# Patient Record
Sex: Male | Born: 1955 | Race: Black or African American | Hispanic: No | Smoking: Former smoker
Health system: Southern US, Community
[De-identification: ages and names within clinical notes are randomized; demographics above are authoritative.]

## PROBLEM LIST (undated history)

## (undated) DIAGNOSIS — I639 Cerebral infarction, unspecified: Secondary | ICD-10-CM

## (undated) DIAGNOSIS — Z21 Asymptomatic human immunodeficiency virus [HIV] infection status: Secondary | ICD-10-CM

## (undated) DIAGNOSIS — B20 Human immunodeficiency virus [HIV] disease: Secondary | ICD-10-CM

## (undated) HISTORY — PX: KNEE SURGERY: SHX244

## (undated) HISTORY — DX: Asymptomatic human immunodeficiency virus (hiv) infection status: Z21

## (undated) HISTORY — DX: Human immunodeficiency virus (HIV) disease: B20

## (undated) HISTORY — PX: OTHER SURGICAL HISTORY: SHX169

## (undated) HISTORY — PX: JOINT REPLACEMENT: SHX530

---

## 1998-07-28 ENCOUNTER — Encounter: Payer: Self-pay | Admitting: *Deleted

## 1998-07-28 ENCOUNTER — Emergency Department (HOSPITAL_COMMUNITY): Admission: EM | Admit: 1998-07-28 | Discharge: 1998-07-28 | Payer: Self-pay | Admitting: *Deleted

## 1999-03-30 ENCOUNTER — Emergency Department (HOSPITAL_COMMUNITY): Admission: EM | Admit: 1999-03-30 | Discharge: 1999-03-30 | Payer: Self-pay | Admitting: Emergency Medicine

## 2003-11-29 ENCOUNTER — Inpatient Hospital Stay (HOSPITAL_COMMUNITY): Admission: EM | Admit: 2003-11-29 | Discharge: 2003-12-07 | Payer: Self-pay | Admitting: Emergency Medicine

## 2003-12-22 ENCOUNTER — Encounter: Admission: RE | Admit: 2003-12-22 | Discharge: 2003-12-22 | Payer: Self-pay | Admitting: Internal Medicine

## 2004-05-10 ENCOUNTER — Encounter: Admission: RE | Admit: 2004-05-10 | Discharge: 2004-05-10 | Payer: Self-pay | Admitting: Internal Medicine

## 2004-05-10 ENCOUNTER — Ambulatory Visit (HOSPITAL_COMMUNITY): Admission: RE | Admit: 2004-05-10 | Discharge: 2004-05-10 | Payer: Self-pay | Admitting: Internal Medicine

## 2007-07-03 ENCOUNTER — Emergency Department (HOSPITAL_COMMUNITY): Admission: EM | Admit: 2007-07-03 | Discharge: 2007-07-03 | Payer: Self-pay | Admitting: Emergency Medicine

## 2008-04-23 ENCOUNTER — Emergency Department (HOSPITAL_COMMUNITY): Admission: EM | Admit: 2008-04-23 | Discharge: 2008-04-23 | Payer: Self-pay | Admitting: Emergency Medicine

## 2011-03-03 NOTE — Discharge Summary (Signed)
NAME:  Adam Richardson, Adam Richardson                         ACCOUNT NO.:  [id number]   MEDICAL RECORD NO.:  [id number]                   PATIENT TYPE:  INP   LOCATION:  3010                                 FACILITY:  [facility]   PHYSICIAN:  Corinna L. Lendell Caprice, MD             DATE OF BIRTH:  09/12/1956   DATE OF ADMISSION:  11/29/2003  DATE OF DISCHARGE:  12/07/2003                                 DISCHARGE SUMMARY   DIAGNOSES:  1. Bacteremic pneumococcal pneumonia.  2. Newly diagnosed human immunodeficiency virus infection with CD4 count of     100.  3. Tobacco abuse, counseled against.  4. Hepatitis C antibody positive, hepatitis B surface antibody positive.  5. Pleurisy.   DISCHARGE MEDICATIONS:  1. Amoxicillin 500 mg p.o. t.i.d. for 5 more days.  2. Bactrim DS one p.o. q.d.  3. Tylenol or Motrin as needed.   FOLLOW UP:  With Dr. Orvan Falconer on March 8 at 10:00 for evaluation of highly  active antiretroviral therapy in the ID clinic at which time HIV viral load  can be followed up.   CONDITION ON DISCHARGE:  Stable.   ACTIVITY:  No heavy exertion until followup visit. 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   DIET:  Regular diet.   CONSULTATIONS:  1. Dr. Jayme Cloud.  2. Dr. Jeanie Sewer.  3. Dr. Cliffton Asters.   PERTINENT LABORATORY DATA:  CBC on admission was unremarkable. ABG on 3  liters of oxygen revealed a pH of 7.435, pCO2 of 31, pO2 90. Erythrocyte  sedimentation rate was 83. PT, INR, and PTT was normal. Complete metabolic  panel was significant for a potassium of 3.4, albumin of 2.3, creatinine of  1.7, total bilirubin 1.6. His creatinine decreased to 0.9, and his potassium  normalized with repletion. Cortisol 16. HIV-1 antibody confirmation  positive. Viral load is pending. Hepatitis C antibody positive. Hepatitis B  surface antigen negative. Hepatitis B core antibody negative. Hepatitis B  surface antibody positive. RPR nonreactive. PPD negative. Blood  cultures  revealed streptococcal which was sensitive to penicillin, Levaquin, and  ceftriaxone. Urine for legionella antigen was negative. AFB of the sputum  negative. Clostridium difficile negative. On chest x-ray on admission showed  left greater than right basilar air space opacities with a small left  pleural effusion. CT of the chest showed bilateral air space opacities with  left lower lobe consolidation concerning for pneumonia. No evidence of  pulmonary embolus. Prominent mediastinal and hilar lymph nodes likely  reactive but nonspecific. CT of the lower extremities showed no DVT. Serial  chest x-rays were done.   HISTORY:  Adam Richardson is a 55 year old black male who presented to the  emergency room with hemoptysis. He had a temperature of 99.7 and respiratory  rate of 30, blood pressure 103/64, heart rate 105, oxygen saturation 91% on  room air. He admitted to smoking and reported that he  was at risk for HIV,  but he would not elaborate on the reason why. He had bronchial breath sounds  at the left base with rhonchi. He had no murmurs. He was very thin  appearing. He was admitted to step down, started on IV antibiotics pending  cultures, with a diagnosis of pneumonia and hemoptysis. He was given  handheld nebulizers, and pulmonary was consulted. Blood cultures began  positive for strep pneumonia, and the patient initially refused HIV test but  subsequently agreed. This was positive. When patient was told of the  results, he became quite hopeless, and there was concern over suicidal  ideation. A psych consult was obtained felt like patient was not suicidal  but that his depression was reaction to his new diagnosis. Infectious  disease was consulted. The patient developed some left sided chest pain  which was pleuritic which improved with ibuprofen. At the time of discharge,  he was feeling much better. His lung sounds were clear. He was febrile. He  had normal oxygen saturations  and was ambulating without oxygen. His outlook  was much better, and he was encouraged to follow up at the ID clinic for  further workup and management. He is being set up with indigent medications  at this time.                                                Corinna L. Lendell Caprice, MD    CLS/MEDQ  D:  12/07/2003  T:  12/08/2003  Job:  [id number]   cc:   Cliffton Asters, M.D.  [address] [place]  [place] [address]  Fax: [phone]

## 2012-03-05 ENCOUNTER — Encounter (HOSPITAL_COMMUNITY): Payer: Self-pay | Admitting: Emergency Medicine

## 2012-03-05 ENCOUNTER — Emergency Department (HOSPITAL_COMMUNITY)
Admission: EM | Admit: 2012-03-05 | Discharge: 2012-03-05 | Disposition: A | Discharge status: Home / Self Care | Payer: BC Managed Care – PPO | Attending: Emergency Medicine | Admitting: Emergency Medicine

## 2012-03-05 ENCOUNTER — Emergency Department (HOSPITAL_COMMUNITY): Payer: BC Managed Care – PPO

## 2012-03-05 DIAGNOSIS — W3189XA Contact with other specified machinery, initial encounter: Secondary | ICD-10-CM | POA: Insufficient documentation

## 2012-03-05 DIAGNOSIS — S62609A Fracture of unspecified phalanx of unspecified finger, initial encounter for closed fracture: Secondary | ICD-10-CM | POA: Insufficient documentation

## 2012-03-05 DIAGNOSIS — T148XXA Other injury of unspecified body region, initial encounter: Secondary | ICD-10-CM | POA: Insufficient documentation

## 2012-03-05 DIAGNOSIS — I10 Essential (primary) hypertension: Secondary | ICD-10-CM | POA: Insufficient documentation

## 2012-03-05 MED ORDER — CEPHALEXIN 250 MG PO CAPS: 250.0000 mg | ORAL_CAPSULE | Freq: Four times a day (QID) | ORAL | Status: AC

## 2012-03-05 MED ORDER — OXYCODONE-ACETAMINOPHEN 5-325 MG PO TABS: 1.0000 | ORAL_TABLET | Freq: Four times a day (QID) | ORAL | Status: AC | PRN

## 2012-03-05 MED ORDER — HYDROMORPHONE HCL PF 1 MG/ML IJ SOLN
1.0000 mg | Freq: Once | INTRAMUSCULAR | Status: AC
  Administered 2012-03-05: 1 mg via INTRAMUSCULAR
  Filled 2012-03-05: qty 1

## 2012-03-05 NOTE — ED Notes (Signed)
Basin filled with n/s solution for pt to soak finger

## 2012-03-05 NOTE — ED Provider Notes (Signed)
History     CSN: [id number]  Arrival date & time 03/05/12  2040   First MD Initiated Contact with Patient 03/05/12 2118      Chief Complaint  Patient presents with  . Finger Injury    (Consider location/radiation/quality/duration/timing/severity/associated sxs/prior treatment) HPI Comments: Patient reports he reached under a running lawn mower with his right hand to clear the grass and his right index finger was hit by the blade.  Reports extreme pain in his finger, worse with flexing finger and with palpation.  Denies other injury.  The history is provided by the patient.    History reviewed. No pertinent past medical history.  Past Surgical History  Procedure Date  . Knee surgery   . Shouldr surgery     No family history on file.  History  Substance Use Topics  . Smoking status: Current Everyday Smoker  . Smokeless tobacco: Not on file  . Alcohol Use: Yes      Review of Systems  Skin: Positive for wound.  Neurological: Negative for weakness and numbness.    Allergies  Review of patient's allergies indicates no known allergies.  Home Medications   Current Outpatient Rx  Name Route Sig Dispense Refill  . IMODIUM A-D PO Oral Take 1 tablet by mouth daily as needed. For diarrhea      BP 161/105  Pulse 98  Temp(Src) 98.1 F (36.7 C) (Oral)  Resp 24  SpO2 98%  Physical Exam  Nursing note and vitals reviewed. Constitutional: He is oriented to person, place, and time. He appears well-developed and well-nourished.  HENT:  Head: Normocephalic and atraumatic.  Neck: Neck supple.  Pulmonary/Chest: Effort normal.  Musculoskeletal:       Hands: Neurological: He is alert and oriented to person, place, and time.    ED Course  Procedures (including critical care time)  Labs Reviewed - No data to display Dg Finger Index Right  03/05/2012  *RADIOLOGY REPORT*  Clinical Data: Injured right index finger while removing grass from a lawn mower.  RIGHT INDEX  FINGER 2+V  Comparison: None.  Findings: Comminuted fracture involving the distal tuft of the distal phalanx.  No other fractures.  Well-preserved joint spaces. Well-preserved bone mineral density.  Associated soft tissue injury.  IMPRESSION: Comminuted fracture involving the distal tuft of the distal phalanx.  Original Report Authenticated By: Arnell Sieving, M.D.     No diagnosis found.    MDM  Patient with comminuted fracture of right distal phalanx index finger.  Pt with dried blood and decreased movement secondary to pain.  Pt signed out to [facility], [place], who will reassess patient after pain medication and cleansing of wound.  Possible open fracture.         Dillard Cannon [place], [place] 03/05/12 2154

## 2012-03-05 NOTE — ED Provider Notes (Signed)
Medical screening examination/treatment/procedure(s) were performed by non-physician practitioner and as supervising physician I was immediately available for consultation/collaboration.  Kennethia Lynes K Linker, MD 03/05/12 2220 

## 2012-03-05 NOTE — Progress Notes (Signed)
Orthopedic Tech Progress Note Patient Details:  Adam Richardson May 17, 1956 [id number]  Type of Splint: Finger Splint Location: right hand Splint Interventions: Application    Nikki Dom 03/05/2012, 11:05 PM

## 2012-03-05 NOTE — ED Provider Notes (Signed)
Medical screening examination/treatment/procedure(s) were performed by non-physician practitioner and as supervising physician I was immediately available for consultation/collaboration.  Anesha Hackert K Linker, MD 03/05/12 2337 

## 2012-03-05 NOTE — ED Provider Notes (Signed)
Patient care resumed from [facility], [place] and has been discussed with Dr. Karma Ganja.  Patient has been diagnosed with a comminuted fracture and will be placed in a finger splint and discharged with antibiotics.  He is been advised to followup with orthopedics and verbalizes understanding of the importance of this followup appointment.  Patient has been reexamined with intact sensation and range of motion of the DIP joint PIP.  There is no evidence of open fracture or repairable laceration.  Digital block performed with 2% lidocaine no epinephrine, 2 mL total.  Patient tolerated well.  Tetanus is up to date with last 2-3 years ago.  Discussed high blood pressure with patient and recommended followup with primary care physician. Explained signs of infection & return precautions  #1 distal comminuted fracture #2 Hypertension    Jaci Carrel, PA-C 03/05/12 2254

## 2012-03-05 NOTE — ED Notes (Signed)
PT.PRESENTS WITH LACERATION AT RIGHT DISTAL INDEX FINGER ACCIDENTALLY HIT BY LAWN MOWER BLADE THIS EVENING WITH MODERATE BLEEDING .

## 2012-03-05 NOTE — Discharge Instructions (Signed)
Finger Fracture Fractures of fingers are breaks in the bones of the fingers. There are many types of fractures. There are different ways of treating these fractures, all of which can be correct. Your caregiver will discuss the best way to treat your fracture. TREATMENT  Finger fractures can be treated with:   Non-reduction - this means the bones are in place. The finger is splinted without changing the positions of the bone pieces. The splint is usually left on for about a week to ten days. This will depend on your fracture and what your caregiver thinks.   Closed reduction - the bones are put back into position without using surgery. The finger is then splinted.   ORIF (open reduction and internal fixation) - the fracture site is opened. Then the bone pieces are fixed into place with pins or some type of hardware. This is seldom required. It depends on the severity of the fracture.  Your caregiver will discuss the type of fracture you have and the treatment that will be best for that problem. If surgery is the treatment of choice, the following is information for you to know and also let your caregiver know about prior to surgery. LET YOUR CAREGIVER KNOW ABOUT:  Allergies   Medications taken including herbs, eye drops, over the counter medications, and creams   Use of steroids (by mouth or creams)   Previous problems with anesthetics or Novocaine   Possibility of pregnancy, if this applies   History of blood clots (thrombophlebitis)   History of bleeding or blood problems   Previous surgery   Other health problems  AFTER THE PROCEDURE After surgery, you will be taken to the recovery area where a nurse will check your progress. Once you're awake, stable, and taking fluids well, barring other problems you will be allowed to go home. Once home an ice pack applied to your operative site may help with discomfort and keep the swelling down. HOME CARE INSTRUCTIONS   Follow your  caregiver's instructions as to activities, exercises, physical therapy, and driving a car.   Use your finger and exercise as directed.   Only take over-the-counter or prescription medicines for pain, discomfort, or fever as directed by your caregiver. Do not take aspirin until your caregiver OK's it, as this can increase bleeding immediately following surgery.   Stop using ibuprofen if it upsets your stomach. Let your caregiver know about it.  SEEK MEDICAL CARE IF:  You have increased bleeding (more than a small spot) from the wound or from beneath your splint.   You develop redness, swelling, or increasing pain in the wound or from beneath your splint.   There is pus coming from the wound or from beneath your splint.   An unexplained oral temperature above 102 F (38.9 C) develops, or as your caregiver suggests.   There is a foul smell coming from the wound or dressing or from beneath your splint.  SEEK IMMEDIATE MEDICAL CARE IF:   You develop a rash.   You have difficulty breathing.   You have any allergic problems.  MAKE SURE YOU:   Understand these instructions.   Will watch your condition.   Will get help right away if you are not doing well or get worse.  Document Released: 01/14/2001 Document Revised: 09/21/2011 Document Reviewed: 05/21/2008 Exit[employer] Patient Information 2012 [employer]. 

## 2014-08-12 ENCOUNTER — Encounter (HOSPITAL_COMMUNITY): Payer: Self-pay | Admitting: Emergency Medicine

## 2014-08-12 ENCOUNTER — Emergency Department (HOSPITAL_COMMUNITY)
Admission: EM | Admit: 2014-08-12 | Discharge: 2014-08-12 | Disposition: A | Discharge status: Home / Self Care | Payer: BC Managed Care – PPO | Attending: Emergency Medicine | Admitting: Emergency Medicine

## 2014-08-12 DIAGNOSIS — H5713 Ocular pain, bilateral: Secondary | ICD-10-CM | POA: Diagnosis present

## 2014-08-12 DIAGNOSIS — Z77098 Contact with and (suspected) exposure to other hazardous, chiefly nonmedicinal, chemicals: Secondary | ICD-10-CM

## 2014-08-12 DIAGNOSIS — Z720 Tobacco use: Secondary | ICD-10-CM | POA: Diagnosis not present

## 2014-08-12 MED ORDER — ERYTHROMYCIN 5 MG/GM OP OINT: TOPICAL_OINTMENT | OPHTHALMIC | Status: AC

## 2014-08-12 MED ORDER — PREDNISOLONE ACETATE 1 % OP SUSP: 2.0000 [drp] | Freq: Two times a day (BID) | OPHTHALMIC | Status: DC

## 2014-08-12 MED ORDER — TETRACAINE HCL 0.5 % OP SOLN
2.0000 [drp] | Freq: Once | OPHTHALMIC | Status: AC
  Administered 2014-08-12: 2 [drp] via OPHTHALMIC
  Filled 2014-08-12: qty 2

## 2014-08-12 MED ORDER — FLUORESCEIN SODIUM 1 MG OP STRP
1.0000 | ORAL_STRIP | Freq: Once | OPHTHALMIC | Status: AC
  Administered 2014-08-12: 1 via OPHTHALMIC
  Filled 2014-08-12: qty 1

## 2014-08-12 NOTE — ED Notes (Signed)
Both eyes being irrigated at this time using LR and morgan lens

## 2014-08-12 NOTE — ED Provider Notes (Signed)
CSN: [id number]     Arrival date & time 08/12/14  1847 History  This chart was scribed for Santiago GladHeather Rupal Childress, PA with Gwyneth SproutWhitney Plunkett, MD by Tonye RoyaltyJoshua Chen, ED Scribe. This patient was seen in room TR04C/TR04C and the patient's care was started at 7:22 PM.    No chief complaint on file.  HPI  HPI Comments: Adam Richardson is a 58 y.o. male who presents to the Emergency Department complaining of burning to his eyes, skin surrounding his eyes, and lips status post getting chemical in them at work just PTA. He states he was using Insta-Flo, a drain cleaner product with sodium hydroxide in it; he states he poured 2 bottles into a drain and that chemical came up when he poured in water and it got into his eyes. He states he began irrigating them with water immediately. He states he can see and denies blurry vision. Denies drainage from the eyes.    No past medical history on file. Past Surgical History  Procedure Laterality Date  . Knee surgery    . Shouldr surgery     No family history on file. History  Substance Use Topics  . Smoking status: Current Every Day Smoker  . Smokeless tobacco: Not on file  . Alcohol Use: Yes    Review of Systems  HENT:       Burning feeling to lips and tissue surrounding his eyes  Eyes: Positive for pain and redness. Negative for visual disturbance.    Allergies  Review of patient's allergies indicates no known allergies.  Home Medications   Prior to Admission medications   Medication Sig Start Date End Date Taking? Authorizing Provider  Loperamide HCl (IMODIUM A-D PO) Take 1 tablet by mouth daily as needed. For diarrhea    Historical Provider, MD   There were no vitals taken for this visit. Physical Exam  Nursing note and vitals reviewed. Constitutional: He is oriented to person, place, and time. He appears well-developed and well-nourished.  HENT:  Head: Normocephalic and atraumatic.  Mouth/Throat: Oropharynx is clear and moist.  Eyes:  Conjunctivae are normal. Pupils are equal, round, and reactive to light. Right eye exhibits no discharge. Left eye exhibits no discharge.  Diffuse injection of both eyes Fluorescein uptake of the left eye at the 4 o'clock position PH=7 of both eyes OD 20/50 OS 20/50 OU 20/40     Neck: Normal range of motion. Neck supple.  Cardiovascular: Normal rate, regular rhythm and normal heart sounds.   Pulmonary/Chest: Effort normal and breath sounds normal.  Musculoskeletal: Normal range of motion.  Neurological: He is alert and oriented to person, place, and time.  Skin: Skin is warm and dry.  No erythema, blisters, or evidence of burns to the skin visualized.  Psychiatric: He has a normal mood and affect.    ED Course  Procedures (including critical care time)  DIAGNOSTIC STUDIES: Oxygen Saturation is 100% on room air, normal by my interpretation.    COORDINATION OF CARE: 7:30 PM Discussed treatment plan with patient at beside, the patient agrees with the plan and has no further questions at this time.   Labs Review Labs Reviewed - No data to display  Imaging Review No results found.   EKG Interpretation None     9:15 PM Discussed with Dr. Karleen HampshireSpencer with Ophthalmology.  He recommends Pred Forte and Erythromycin ointment.  He recommends follow up in the office tomorrow.  Patient reports that pain of the eyes has improved. MDM  Final diagnoses:  None   Patient presenting with a chemical exposure to both eyes.  Visual acuity 20/50.  Both eyes flushed with Nona DellMorgan Lens.  PH of both eyes is 7.  Patient with Fluorescein uptake of the left eye.  Patient discussed with Dr. Karleen HampshireSpencer who is on call for Ophthalmology.  He recommends giving the patient Pred Forte and Erythromycin ointment.  He states that the patient can follow up in the office tomorrow.  Patient stable for discharge.  Return precautions given.   Santiago GladHeather Keidrick Murty, PA-C 08/12/14 2259

## 2014-08-12 NOTE — ED Notes (Signed)
Pt to ED with co worker, st's he was trying to unclog a drain with product called Insta-Flo (acid) pt st's he put one bottle in drain and it didn't work so he put the second bottle in and when he put the water in the product boiled up in his face.  Pt c/o burns to bil eyes, eye lids and lips.  Pt st's he instantly flushed eyes with water.

## 2014-08-12 NOTE — ED Notes (Signed)
OD 20/50  OS 20/50  OU 20/40

## 2014-08-13 NOTE — ED Provider Notes (Signed)
Medical screening examination/treatment/procedure(s) were performed by non-physician practitioner and as supervising physician I was immediately available for consultation/collaboration.   EKG Interpretation None        Gwyneth SproutWhitney Carmilla Granville, MD 08/13/14 0020

## 2016-02-23 ENCOUNTER — Ambulatory Visit (INDEPENDENT_AMBULATORY_CARE_PROVIDER_SITE_OTHER): Payer: BC Managed Care – PPO | Admitting: Family Medicine

## 2016-02-23 VITALS — BP 120/64 | HR 64 | Temp 97.7°F | Resp 18 | Ht 67.0 in | Wt 154.0 lb

## 2016-02-23 DIAGNOSIS — B20 Human immunodeficiency virus [HIV] disease: Secondary | ICD-10-CM | POA: Diagnosis not present

## 2016-02-23 DIAGNOSIS — R404 Transient alteration of awareness: Secondary | ICD-10-CM

## 2016-02-23 LAB — POCT URINALYSIS DIP (MANUAL ENTRY)
Bilirubin, UA: NEGATIVE
Blood, UA: NEGATIVE
GLUCOSE UA: NEGATIVE
Ketones, POC UA: NEGATIVE
Leukocytes, UA: NEGATIVE
NITRITE UA: NEGATIVE
Protein Ur, POC: NEGATIVE
Spec Grav, UA: <=1.005
UROBILINOGEN UA: 0.2
pH, UA: 5

## 2016-02-23 LAB — POCT CBC
Granulocyte percent: 45.4 %G (ref 37–80)
HCT, POC: 44.5 % (ref 43.5–53.7)
Hemoglobin: 15.9 g/dL (ref 14.1–18.1)
LYMPH, POC: 1.9 (ref 0.6–3.4)
MCH: 34.5 pg — AB (ref 27–31.2)
MCHC: 35.8 g/dL — AB (ref 31.8–35.4)
MCV: 96.5 fL (ref 80–97)
MID (CBC): 0.4 (ref 0–0.9)
MPV: 8.1 fL (ref 0–99.8)
POC Granulocyte: 2 (ref 2–6.9)
POC LYMPH PERCENT: 10.3 %L (ref 10–50)
POC MID %: 44.3 % — AB (ref 0–12)
Platelet Count, POC: 207 10*3/uL (ref 142–424)
RBC: 4.61 M/uL — AB (ref 4.69–6.13)
RDW, POC: 12.5 %
WBC: 4.3 10*3/uL — AB (ref 4.6–10.2)

## 2016-02-23 LAB — COMPREHENSIVE METABOLIC PANEL
ALT: 35 U/L (ref 9–46)
AST: 33 U/L (ref 10–35)
Albumin: 4.6 g/dL (ref 3.6–5.1)
Alkaline Phosphatase: 82 U/L (ref 40–115)
BUN: 13 mg/dL (ref 7–25)
CHLORIDE: 105 mmol/L (ref 98–110)
CO2: 24 mmol/L (ref 20–31)
Calcium: 9.7 mg/dL (ref 8.6–10.3)
Creat: 1.04 mg/dL (ref 0.70–1.25)
GLUCOSE: 83 mg/dL (ref 65–99)
POTASSIUM: 4.3 mmol/L (ref 3.5–5.3)
Sodium: 140 mmol/L (ref 135–146)
Total Bilirubin: 0.7 mg/dL (ref 0.2–1.2)
Total Protein: 7.9 g/dL (ref 6.1–8.1)

## 2016-02-23 LAB — TSH: TSH: 1.03 m[IU]/L (ref 0.40–4.50)

## 2016-02-23 LAB — POC MICROSCOPIC URINALYSIS (UMFC): MUCUS RE: ABSENT

## 2016-02-23 LAB — GLUCOSE, POCT (MANUAL RESULT ENTRY): POC Glucose: 83 mg/dl (ref 70–99)

## 2016-02-23 NOTE — Patient Instructions (Addendum)
  1. P[facility] PRES[facility] TO [facility] NOW FOR FURTHER EVALUATION OF ALTERED MENTAL STATUS.     IF you received an x-ray today, you will receive an invoice from [facility]. Please contact [facility] at [phone] with questions or concerns regarding your invoice.   IF you received labwork today, you will receive an invoice from [facility]. Please contact [facility] at [phone] with questions or concerns regarding your invoice.   Our billing staff will not be able to assist you with questions regarding bills from these companies.  You will be contacted with the lab results as soon as they are available. The fastest way to get your results is to activate your My Chart account. Instructions are located on the last page of this paperwork. If you have not heard from us regarding the results in 2 weeks, please contact this office.

## 2016-02-23 NOTE — Progress Notes (Signed)
Subjective:    Patient ID: POPE BRUNTY, male    DOB: 1956/08/03, 60 y.o.   MRN: [id number]  02/23/2016  Memory Loss   HPI This 60 y.o. male presents for evaluation of memory loss. Yesterday around 1:00pm had an episode where went into a service room.  Had not eaten anything that day. Started getting sleepy.  Closed eyes and fell asleep.  Woke up and felt a really strange feeling. Unable to talk; had the mindset to talk but unable to speak.  Tried to explain to supervisor that something was wrong.  Supervisor realized that something was wrong with patient.  Left office and went back and tried to talk out loud and unable to speak.  Saw some people and unable to speak back; unable to speak back.  Tried to call wife and only could say is something is worng.  Supervisor found patient and advised them to go home; supervisor/boss and advised patient to seek medical advise. Did not seek medical care; went home and got in bed at 3:00pm.  Woke up around at 800pm; still had not eaten anything for the day. Ate upon awakening; went back to sleep at 10:00pm.  Woke up this morning and feel normal.  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  Also office is next to elevator.  Thinks that cleaning chemicals affected.  Went to work this morning and coworkers state that patient still looks "crazy".  Has not eaten this morning.  Wife thinks that patient is pale.  Feels almost fine.  Yesterday out of my mind and could not think.  Could not express any words.  Wife reports slurred speech when patient called her yesterday on cell phone.  Had a bad childhood but for the past ten years had been doing well.  Boss sent patient here today.  No headache.  No blurred vision; mild dizziness.  +tinnitus chronic.  Normal hearing.  L leg weakness yet recent injury.  No numbness or tingling; +tingling and numbness in B hands chronic.   Last night, dreaming during sleep and urinated a small amount and got up immediately.  This has occurred int he past with heavy alcohol intake.  No alcohol intake.  No tongue biting.  No seizure.  No fever +chills but has been cool.  Needs a repair of the thermostat.  No n/v/d.  +loose stools.  Wife picked up patient yesterday from work; wife took patient home; wife prepared meal at 8:00pm; patient able to speak at 8:00pm when awoke.  Pt was speaking unusual but was much improved. Still having to concentrate really hard on what he is stating.  Still feels that something is wrong.  +tobacco but cutting back cigarettes.  No drugs; last drug use 2008.  Last alcohol intake every night 2 shots to help with sleep; had 2 shots last night; more shots on weekends; drinks 1 pint on weekends.  Drives; has license.  No new medications in past year.  No cessation of medications.  [facility]; [facility] three years ago.  Prostate screening UTD.  In office, really strong chemical odor/smell.   Surrounding office, dishwasher with excessive heat.    HIV +; maintained on medication; cannot pronounced; takes one bid.  Takes gray pill every morning; takes yellow pill at night.  Viral load < 20.  Last blood work last week.  Not sure how got HIV.  [place] in the service; had sex with prostitute.    [facility] [place]; followed every 6 months.    Works two jobs; 60 hours per week.    Per wife, last night patient had a difficult time recalling events of the day.  Move of office occurred 3 weeks ago but yesterday was first day in office.   Review of Systems  Constitutional: Negative for fever, chills, diaphoresis, activity change, appetite change and fatigue.  Respiratory: Negative for cough and shortness of breath.   Cardiovascular: Negative for chest pain, palpitations and leg swelling.  Gastrointestinal: Negative for nausea, vomiting, abdominal pain and diarrhea.    Endocrine: Negative for cold intolerance, heat intolerance, polydipsia, polyphagia and polyuria.  Skin: Negative for color change, rash and wound.  Neurological: Negative for dizziness, tremors, seizures, syncope, facial asymmetry, speech difficulty, weakness, light-headedness, numbness and headaches.  Psychiatric/Behavioral: Positive for confusion and decreased concentration. Negative for suicidal ideas, hallucinations, behavioral problems, sleep disturbance, self-injury, dysphoric mood and agitation. The patient is not nervous/anxious.     Past Medical History  Diagnosis Date  . HIV infection [facility](HCC)    Past Surgical History  Procedure Laterality Date  . Knee surgery    . Shouldr surgery    . Knee surgery    . Joint replacement     No Known Allergies Current Outpatient Prescriptions  Medication Sig Dispense Refill  . acetaminophen (TYLENOL) 500 MG tablet Take 500 mg by mouth every 6 (six) hours as needed for mild pain. Reported on 02/23/2016    . cyclobenzaprine (FLEXERIL) 5 MG tablet Take 5 mg by mouth 3 (three) times daily as needed for muscle spasms. Reported on 02/23/2016    . erythromycin ophthalmic ointment Place a 1/2 inch ribbon of ointment into the lower eyelid.  Apply to eyelid at bedtime (Patient not taking: Reported on 02/23/2016) 1 g 0  . gabapentin (NEURONTIN) 600 MG tablet Take 600 mg by mouth daily. Reported on 02/23/2016    . ibuprofen (ADVIL,MOTRIN) 600 MG tablet Take 600 mg by mouth every 6 (six) hours as needed for moderate pain. Reported on 02/23/2016    . prednisoLONE acetate (PRED FORTE) 1 % ophthalmic suspension Place 2 drops into both eyes 2 (two) times daily. (Patient not taking: Reported on 02/23/2016) 5 mL 0  . traMADol (ULTRAM) 50 MG tablet Take 50 mg by mouth every 6 (six) hours as needed for moderate pain. Reported on 02/23/2016     No current facility-administered medications for this visit.   Social History   Social History  . Marital Status: Divorced     Spouse Name: N/A  . Number of Children: N/A  . Years of Education: N/A   Occupational History  . Not on file.   Social History Main Topics  . Smoking status: Current Every Day Smoker  . Smokeless tobacco: Not on file  . Alcohol Use: Yes     Comment: one everyday  . Drug Use: No  . Sexual Activity: Not on file   Other Topics Concern  . Not on file   Social History Narrative   Family History  Problem Relation Age of Onset  . Diabetes Mother   . Heart disease Mother   . Diabetes Father   . Heart disease Father        Objective:    BP 120/64 mmHg  Pulse 64  Temp(Src) 97.7 F (36.5 C)  Resp 18  Ht 5\' 7"  (1.702 m)  Wt 154 lb (69.854 kg)  BMI 24.11 kg/m2  SpO2 99% Physical Exam  Constitutional: He is oriented to person, place, and time. He appears well-developed and well-nourished. No distress.  HENT:  Head: Normocephalic and atraumatic.  Right Ear: External ear normal.  Left Ear: External ear normal.  Nose: Nose normal.  Mouth/Throat: Oropharynx is clear and moist.  Eyes: Conjunctivae and EOM are normal. Pupils are equal, round, and reactive to light.  Neck: Normal range of motion. Neck supple. Carotid bruit is not present. No thyromegaly present.  Cardiovascular: Normal rate, regular rhythm, normal heart sounds and intact distal pulses.  Exam reveals no gallop and no friction rub.   No murmur heard. Pulmonary/Chest: Effort normal and breath sounds normal. He has no wheezes. He has no rales.  Abdominal: Soft. Bowel sounds are normal. He exhibits no distension and no mass. There is no tenderness. There is no rebound and no guarding.  Lymphadenopathy:    He has no cervical adenopathy.  Neurological: He is alert and oriented to person, place, and time. No cranial nerve deficit. He exhibits normal muscle tone. Coordination normal.  Skin: Skin is warm and dry. No rash noted. He is not diaphoretic.  Psychiatric: He has a normal mood and affect. His behavior is normal.  Judgment and thought content normal.  Nursing note and vitals reviewed.  Results for orders placed or performed in visit on 02/23/16  Urine culture  Result Value Ref Range   Colony Count NO GROWTH    Organism ID, Bacteria NO GROWTH   Comprehensive metabolic panel  Result Value Ref Range   Sodium 140 135 - 146 mmol/L   Potassium 4.3 3.5 - 5.3 mmol/L   Chloride 105 98 - 110 mmol/L   CO2 24 20 - 31 mmol/L   Glucose, Bld 83 65 - 99 mg/dL   BUN 13 7 - 25 mg/dL   Creat 1.61 0.96 - 0.45 mg/dL   Total Bilirubin 0.7 0.2 - 1.2 mg/dL   Alkaline Phosphatase 82 40 - 115 U/L   AST 33 10 - 35 U/L   ALT 35 9 - 46 U/L   Total Protein 7.9 6.1 - 8.1 g/dL   Albumin 4.6 3.6 - 5.1 g/dL   Calcium 9.7 8.6 - 40.9 mg/dL  TSH  Result Value Ref Range   TSH 1.03 0.40 - 4.50 mIU/L  POCT CBC  Result Value Ref Range   WBC 4.3 (A) 4.6 - 10.2 K/uL   Lymph, poc 1.9 0.6 - 3.4   POC LYMPH PERCENT 10.3 10 - 50 %L   MID (cbc) 0.4 0 - 0.9   POC MID % 44.3 (A) 0 - 12 %M   POC Granulocyte 2.0 2 - 6.9   Granulocyte percent 45.4 37 - 80 %G   RBC 4.61 (A) 4.69 - 6.13 M/uL   Hemoglobin 15.9 14.1 - 18.1 g/dL   HCT, POC 81.1 91.4 - 53.7 %   MCV 96.5 80 - 97 fL   MCH, POC 34.5 (A) 27 - 31.2 pg   MCHC 35.8 (A) 31.8 - 35.4 g/dL   RDW, POC 78.2 %   Platelet Count, POC 207 142 - 424 K/uL   MPV 8.1 0 - 99.8 fL  POCT glucose (manual entry)  Result Value Ref Range   POC Glucose 83 70 - 99 mg/dl  POCT urinalysis dipstick  Result Value Ref Range   Color, UA yellow yellow   Clarity, UA clear clear   Glucose, UA negative negative  Bilirubin, UA negative negative   Ketones, POC UA negative negative   Spec Grav, UA <=1.005    Blood, UA negative negative   pH, UA 5.0    Protein Ur, POC negative negative   Urobilinogen, UA 0.2    Nitrite, UA Negative Negative   Leukocytes, UA Negative Negative  POCT Microscopic Urinalysis ([facility])  Result Value Ref Range   WBC,UR,HPF,POC None None WBC/hpf   RBC,UR,HPF,POC None None  RBC/hpf   Bacteria None None, Too numerous to count   Mucus Absent Absent   Epithelial Cells, UR Per Microscopy None None, Too numerous to count cells/hpf       Assessment & Plan:   1. Transient alteration of awareness   2. HIV disease (HCC)    -New. -Onset 24 hours ago with mild improvement yet has not returned to baseline. -pt reports compliance HIV medications and reports undetectable viral load; no records for review. -to ED at the [facility] in [place] for CT head, cardiac enzymes, labs, further work up. -potential chemical exposure at work yesterday with onset of symptoms.   -normal neurological exam in office.  Wife desires to transport patient to [facility]; agreeable due to gradual improvement and 24 hour duration of symptoms.   Orders Placed This Encounter  Procedures  . Urine culture  . Comprehensive metabolic panel  . TSH  . POCT CBC  . POCT glucose (manual entry)  . POCT urinalysis dipstick  . POCT Microscopic Urinalysis ([facility])  . EKG 12-Lead   No orders of the defined types were placed in this encounter.    No Follow-up on file.    Christophe Rising Paulita Fujita, M.D. [facility] [address] [place], [place]  [address] [phone] phone [phone] fax

## 2016-02-24 LAB — URINE CULTURE
Colony Count: NO GROWTH
ORGANISM ID, BACTERIA: NO GROWTH

## 2016-03-19 ENCOUNTER — Encounter: Payer: Self-pay | Admitting: Family Medicine

## 2016-07-08 ENCOUNTER — Observation Stay (HOSPITAL_COMMUNITY)
Admission: EM | Admit: 2016-07-08 | Discharge: 2016-07-10 | Disposition: A | Discharge status: Home Health | Payer: BC Managed Care – PPO | Attending: Internal Medicine | Admitting: Internal Medicine

## 2016-07-08 ENCOUNTER — Encounter (HOSPITAL_COMMUNITY): Payer: Self-pay | Admitting: *Deleted

## 2016-07-08 ENCOUNTER — Observation Stay (HOSPITAL_COMMUNITY): Payer: BC Managed Care – PPO

## 2016-07-08 ENCOUNTER — Emergency Department (HOSPITAL_COMMUNITY): Payer: BC Managed Care – PPO

## 2016-07-08 DIAGNOSIS — I82409 Acute embolism and thrombosis of unspecified deep veins of unspecified lower extremity: Secondary | ICD-10-CM | POA: Diagnosis not present

## 2016-07-08 DIAGNOSIS — F1410 Cocaine abuse, uncomplicated: Secondary | ICD-10-CM | POA: Insufficient documentation

## 2016-07-08 DIAGNOSIS — G8191 Hemiplegia, unspecified affecting right dominant side: Secondary | ICD-10-CM | POA: Diagnosis not present

## 2016-07-08 DIAGNOSIS — F17200 Nicotine dependence, unspecified, uncomplicated: Secondary | ICD-10-CM | POA: Insufficient documentation

## 2016-07-08 DIAGNOSIS — B20 Human immunodeficiency virus [HIV] disease: Secondary | ICD-10-CM | POA: Insufficient documentation

## 2016-07-08 DIAGNOSIS — G40909 Epilepsy, unspecified, not intractable, without status epilepticus: Principal | ICD-10-CM | POA: Insufficient documentation

## 2016-07-08 DIAGNOSIS — R569 Unspecified convulsions: Secondary | ICD-10-CM

## 2016-07-08 DIAGNOSIS — Z7982 Long term (current) use of aspirin: Secondary | ICD-10-CM | POA: Insufficient documentation

## 2016-07-08 DIAGNOSIS — R4701 Aphasia: Secondary | ICD-10-CM | POA: Insufficient documentation

## 2016-07-08 DIAGNOSIS — E785 Hyperlipidemia, unspecified: Secondary | ICD-10-CM

## 2016-07-08 DIAGNOSIS — Z8673 Personal history of transient ischemic attack (TIA), and cerebral infarction without residual deficits: Secondary | ICD-10-CM | POA: Diagnosis not present

## 2016-07-08 DIAGNOSIS — B1920 Unspecified viral hepatitis C without hepatic coma: Secondary | ICD-10-CM | POA: Clinically undetermined

## 2016-07-08 DIAGNOSIS — R262 Difficulty in walking, not elsewhere classified: Secondary | ICD-10-CM

## 2016-07-08 DIAGNOSIS — I10 Essential (primary) hypertension: Secondary | ICD-10-CM | POA: Insufficient documentation

## 2016-07-08 DIAGNOSIS — Z79899 Other long term (current) drug therapy: Secondary | ICD-10-CM | POA: Diagnosis not present

## 2016-07-08 DIAGNOSIS — R269 Unspecified abnormalities of gait and mobility: Secondary | ICD-10-CM

## 2016-07-08 DIAGNOSIS — F1910 Other psychoactive substance abuse, uncomplicated: Secondary | ICD-10-CM | POA: Diagnosis not present

## 2016-07-08 DIAGNOSIS — M6281 Muscle weakness (generalized): Secondary | ICD-10-CM

## 2016-07-08 HISTORY — DX: Cerebral infarction, unspecified: I63.9

## 2016-07-08 LAB — URINALYSIS, ROUTINE W REFLEX MICROSCOPIC
BILIRUBIN URINE: NEGATIVE
GLUCOSE, UA: NEGATIVE mg/dL
Ketones, ur: NEGATIVE mg/dL
Leukocytes, UA: NEGATIVE
NITRITE: NEGATIVE
PH: 5.5 (ref 5.0–8.0)
Protein, ur: NEGATIVE mg/dL
SPECIFIC GRAVITY, URINE: 1.011 (ref 1.005–1.030)

## 2016-07-08 LAB — URINE MICROSCOPIC-ADD ON

## 2016-07-08 LAB — BASIC METABOLIC PANEL
ANION GAP: 16 — AB (ref 5–15)
BUN: 11 mg/dL (ref 6–20)
CHLORIDE: 109 mmol/L (ref 101–111)
CO2: 14 mmol/L — AB (ref 22–32)
Calcium: 9.4 mg/dL (ref 8.9–10.3)
Creatinine, Ser: 1.18 mg/dL (ref 0.61–1.24)
GFR calc Af Amer: >60 mL/min (ref >60)
GFR calc non Af Amer: >60 mL/min (ref >60)
GLUCOSE: 129 mg/dL — AB (ref 65–99)
POTASSIUM: 3.8 mmol/L (ref 3.5–5.1)
Sodium: 139 mmol/L (ref 135–145)

## 2016-07-08 LAB — ETHANOL

## 2016-07-08 LAB — RAPID URINE DRUG SCREEN, HOSP PERFORMED
Amphetamines: NOT DETECTED
BARBITURATES: NOT DETECTED
Benzodiazepines: NOT DETECTED
COCAINE: NOT DETECTED
Opiates: NOT DETECTED
TETRAHYDROCANNABINOL: NOT DETECTED

## 2016-07-08 LAB — CBG MONITORING, ED: Glucose-Capillary: 116 mg/dL — ABNORMAL HIGH (ref 65–99)

## 2016-07-08 LAB — CBC
HEMATOCRIT: 42.3 % (ref 39.0–52.0)
HEMOGLOBIN: 14 g/dL (ref 13.0–17.0)
MCH: 31.7 pg (ref 26.0–34.0)
MCHC: 33.1 g/dL (ref 30.0–36.0)
MCV: 95.7 fL (ref 78.0–100.0)
Platelets: 210 10*3/uL (ref 150–400)
RBC: 4.42 MIL/uL (ref 4.22–5.81)
RDW: 13.4 % (ref 11.5–15.5)
WBC: 7.1 10*3/uL (ref 4.0–10.5)

## 2016-07-08 LAB — MRSA PCR SCREENING: MRSA by PCR: NEGATIVE

## 2016-07-08 MED ORDER — SODIUM CHLORIDE 0.9 % IV SOLN
1000.0000 mg | Freq: Once | INTRAVENOUS | Status: AC
  Administered 2016-07-08: 1000 mg via INTRAVENOUS
  Filled 2016-07-08: qty 10

## 2016-07-08 MED ORDER — LORAZEPAM 2 MG/ML IJ SOLN: 1.0000 mg | Freq: Four times a day (QID) | INTRAMUSCULAR | Status: DC | PRN

## 2016-07-08 MED ORDER — GADOBENATE DIMEGLUMINE 529 MG/ML IV SOLN
15.0000 mL | Freq: Once | INTRAVENOUS | Status: AC | PRN
  Administered 2016-07-08: 15 mL via INTRAVENOUS

## 2016-07-08 MED ORDER — SODIUM CHLORIDE 0.9 % IV BOLUS (SEPSIS)
1000.0000 mL | Freq: Once | INTRAVENOUS | Status: AC
  Administered 2016-07-08: 1000 mL via INTRAVENOUS

## 2016-07-08 MED ORDER — VITAMIN B-1 100 MG PO TABS
100.0000 mg | ORAL_TABLET | Freq: Every day | ORAL | Status: DC
  Administered 2016-07-09 – 2016-07-10 (×2): 100 mg via ORAL
  Filled 2016-07-08 (×2): qty 1

## 2016-07-08 MED ORDER — FOLIC ACID 1 MG PO TABS
1.0000 mg | ORAL_TABLET | Freq: Every day | ORAL | Status: DC
Start: 2016-07-08 — End: 2016-07-10
  Administered 2016-07-09 – 2016-07-10 (×2): 1 mg via ORAL
  Filled 2016-07-08 (×2): qty 1

## 2016-07-08 MED ORDER — LORAZEPAM 2 MG/ML IJ SOLN: 2.0000 mg | Freq: Once | INTRAMUSCULAR | Status: DC | PRN

## 2016-07-08 MED ORDER — LORAZEPAM 2 MG/ML IJ SOLN
INTRAMUSCULAR | Status: AC
  Filled 2016-07-08: qty 1

## 2016-07-08 MED ORDER — LORAZEPAM 1 MG PO TABS: 1.0000 mg | ORAL_TABLET | Freq: Four times a day (QID) | ORAL | Status: DC | PRN

## 2016-07-08 MED ORDER — TETANUS-DIPHTH-ACELL PERTUSSIS 5-2.5-18.5 LF-MCG/0.5 IM SUSP
0.5000 mL | Freq: Once | INTRAMUSCULAR | Status: AC
  Administered 2016-07-09: 0.5 mL via INTRAMUSCULAR
  Filled 2016-07-08 (×2): qty 0.5

## 2016-07-08 MED ORDER — LORAZEPAM 2 MG/ML IJ SOLN
2.0000 mg | Freq: Once | INTRAMUSCULAR | Status: AC
  Administered 2016-07-08: 2 mg via INTRAVENOUS

## 2016-07-08 MED ORDER — ADULT MULTIVITAMIN W/MINERALS CH
1.0000 | ORAL_TABLET | Freq: Every day | ORAL | Status: DC
  Administered 2016-07-09 – 2016-07-10 (×2): 1 via ORAL
  Filled 2016-07-08 (×2): qty 1

## 2016-07-08 MED ORDER — ENOXAPARIN SODIUM 40 MG/0.4ML ~~LOC~~ SOLN
40.0000 mg | SUBCUTANEOUS | Status: DC
  Administered 2016-07-08 – 2016-07-10 (×3): 40 mg via SUBCUTANEOUS
  Filled 2016-07-08 (×3): qty 0.4

## 2016-07-08 MED ORDER — SODIUM CHLORIDE 0.9 % IV SOLN
INTRAVENOUS | Status: DC
  Administered 2016-07-08: 18:00:00 via INTRAVENOUS
  Administered 2016-07-10: 125 mL/h via INTRAVENOUS

## 2016-07-08 MED ORDER — THIAMINE HCL 100 MG/ML IJ SOLN
100.0000 mg | Freq: Every day | INTRAMUSCULAR | Status: DC
  Administered 2016-07-08: 100 mg via INTRAVENOUS
  Filled 2016-07-08: qty 2

## 2016-07-08 MED ORDER — KETOROLAC TROMETHAMINE 30 MG/ML IJ SOLN
30.0000 mg | Freq: Four times a day (QID) | INTRAMUSCULAR | Status: DC | PRN
  Administered 2016-07-08 – 2016-07-09 (×2): 30 mg via INTRAVENOUS
  Filled 2016-07-08 (×2): qty 1

## 2016-07-08 NOTE — ED Notes (Signed)
Attempted report 

## 2016-07-08 NOTE — H&P (Signed)
Date: 07/08/2016               Patient Name:  Adam Richardson MRN: [id number]  DOB: [birth year] Age / Sex: 60 y.o., male   PCP: Konrad Felix, NP         Medical Service: Internal Medicine Teaching Service         Attending Physician: Dr. Judyann Munson, MD    First Contact: Dr. Thomasene Lot Pager: [phone]  Second Contact: Dr. Gara Kroner Pager: [phone]       After Hours (After 5p/  First Contact Pager: [phone]  weekends / holidays): Second Contact Pager: [phone]   Chief Complaint: Seizure  History of Present Illness: The patient is a 60 year old male with a past medical history of HIV (CD4 497 12/2015, detectable viral load on 02/16/2016 concerning for partial compliance with ART of abacavir/dolutegravir/lamivudine), hepatitis C, polysubstance abuse (cocaine, tobacco, alcohol) and left MCA stroke who presents to the [facility] emergency department on 07/08/2016 by EMS for seizure-like activity. History of present illness was obtained from other providers and the electronic medical record as the patient was postictal, sedated and unable to participate in the examination. Per the emergency department physician the patient had tonic-clonic movement this morning, his wife was awakened to use the restroom and saw him shaking. This lasted approximately 5 minutes. Following this episode 911 was called and the patient was brought to the emergency department for further evaluation and treatment. Per the wife the patient has been in his usual state of health without fevers, vomiting, cough or other infectious prodrome.  In the emergency department the patient was afebrile. BP 138/74, pulse 92, respiratory rate 15 satting 100% on room air. Basic metabolic panel demonstrated CO2 of 14 and glucose of 129. CBC was entirely within normal limits. Ethanol level was negative. Urinalysis without evidence of infection. Rapid drug screen negative. MRI brain with no acute intracranial abnormality, chronic  left MCA infarct which could potentially be a seizure focus. While in the emergency department, following the patient's MRI, he began to have a bilateral full body seizure lasting approximately 2 minutes. While in the emergency department the patient was given lorazepam and loaded with 1000 mg of Keppra. Neurology has been consulted and an EEG is being obtained.  Meds:  Current Meds  Medication Sig  . abacavir-dolutegravir-lamiVUDine (TRIUMEQ) 600-50-300 MG tablet Take 1 tablet by mouth daily.  Marland Kitchen amLODipine (NORVASC) 10 MG tablet Take 5 mg by mouth daily.  Marland Kitchen aspirin 81 MG chewable tablet Chew 81 mg by mouth daily.  Marland Kitchen atorvastatin (LIPITOR) 40 MG tablet Take 20 mg by mouth at bedtime.  . baclofen (LIORESAL) 10 MG tablet Take 10 mg by mouth 3 (three) times daily as needed for muscle spasms.  Marland Kitchen docusate sodium (COLACE) 100 MG capsule Take 200 mg by mouth daily as needed (to prevent constipation).  Marland Kitchen dolutegravir (TIVICAY) 50 MG tablet Take 50 mg by mouth at bedtime.  . Elbasvir-Grazoprevir (ZEPATIER) 50-100 MG TABS Take 1 tablet by mouth daily. 12 week course for Hep C treatment to end on 07/25/16  . ibuprofen (ADVIL,MOTRIN) 600 MG tablet Take 600 mg by mouth 3 (three) times daily as needed (pain and inflammation). Reported on 02/23/2016  . Multiple Vitamin (MULTIVITAMIN WITH MINERALS) TABS tablet Take 1 tablet by mouth daily.  . traMADol (ULTRAM) 50 MG tablet Take 50 mg by mouth 4 (four) times daily as needed (pain). Reported on 02/23/2016     Allergies: Allergies as  of 07/08/2016  . (No Known Allergies)   Past Medical History:  Diagnosis Date  . HIV infection (HCC)   . Stroke [facility](HCC)     Family History: Unable to be obtained at time of presentation secondary to mental status  Social History: Patient with known polysubstance abuse  Review of Systems: A complete ROS was unable to be obtained secondary to mental status in a postictal patient sedated with lorazepam and loaded with  Keppra  Physical Exam: Blood pressure (!) 168/108, pulse 88, temperature 97 F (36.1 C), temperature source Rectal, resp. rate 21, height 5\' 10"  (1.778 m), weight 160 lb (72.6 kg), SpO2 100 %. Physical Exam  Constitutional: He appears well-developed and well-nourished.  Postictal, drowsy  HENT:  Head: Normocephalic and atraumatic.  Cardiovascular: Normal rate and regular rhythm.  Exam reveals no gallop and no friction rub.   No murmur heard. Respiratory: Effort normal and breath sounds normal.  GI: Soft. Bowel sounds are normal. He exhibits no distension.  Musculoskeletal: He exhibits no edema.  Neurological:  Patient was post ictal. He was not formally oriented 3.     EKG: No evidence of acute ST segment changes  CXR: Not obtained  Assessment & Plan by Problem: [name] is a 60 year old African-American male with a past medical history of HIV, polysubstance abuse, hepatitis C and previous left MCA stroke who presents with a one-day history of seizures.  1. Seizures Patient presented with a seizure at home and a single witnessed seizure in the emergency department. It is unclear if he has had seizures in the past. Currently, he has no evidence of infection. His urine drug screen was negative. MRI of the brain demonstrates chronic left MCA infarct without acute intracranial abnormality. The patient's previous left MCA infarct may be acting as an epileptogenic focus and may be the etiology of the patient's seizures. -- Keppra 1000 mg 1 then 500 mg twice a day -- Neurology consulted -- EEG  2. Polysubstance abuse Patient with known polysubstance abuse including tobacco, cocaine and alcohol. UDS the emergency department was negative. -- CIWA protocol -- Folic acid, thiamine  3. HIV Patient with a history of HIV infection. Most recent CD4 count on 12/2015 of 497. Patient currently nothing by mouth secondary to seizure activity. -- Restart HIV medications when tolerating PO --  Home Regimen (abacavir/dolutegravir/lamivudine)  4. DVT/PE prophylaxis -- Lovenox 40 mg subcutaneous injection once daily  Dispo: Admit patient to Observation with expected length of stay less than 2 midnights.  Signed: Thomasene LotJames Elhadj Girton, MD 07/08/2016, 12:44 PM  Pager: [phone]

## 2016-07-08 NOTE — Consult Note (Signed)
Neurology Consultation Reason for Consult: Seizures Referring Physician: Rancour, S  CC: seizures  History is obtained from:patient  HPI: Adam Richardson is a 60 y.o. male with a history of previous stroke and HIV who presents with new onset seizures. He apparently had a seizure around and was improving, following commands but not speaking. He then had subsequent recurrent seizure which lasted approximately 2 minutes. He was given Ativan, and since that time has been postictal and sedated and neurology has been consulted for further evaluation and recommendations.     ROS:   Unable to obtain due to altered mental status.   Past Medical History:  Diagnosis Date  . HIV infection (HCC)   . Stroke [facility])      Family History  Problem Relation Age of Onset  . Diabetes Mother   . Heart disease Mother   . Diabetes Father   . Heart disease Father      Social History:  reports that he has been smoking.  He does not have any smokeless tobacco history on file. He reports that he drinks alcohol. He reports that he does not use drugs.   Exam: Current vital signs: BP 138/74 (BP Location: Right Arm)   Pulse 92   Temp 97 F (36.1 C) (Rectal)   Resp 15   Ht 5\' 10"  (1.778 m)   Wt 72.6 kg (160 lb)   SpO2 100%   BMI 22.96 kg/m  Vital signs in last 24 hours: Temp:  [97 F (36.1 C)-98.4 F (36.9 C)] 97 F (36.1 C) (09/23 0920) Pulse Rate:  [66-104] 92 (09/23 1001) Resp:  [15-23] 15 (09/23 1001) BP: (78-160)/(48-80) 138/74 (09/23 1001) SpO2:  [93 %-100 %] 100 % (09/23 1001) Weight:  [72.6 kg (160 lb)] 72.6 kg (160 lb) (09/23 0543)   Physical Exam  Constitutional: Appears well-developed and well-nourished.  Psych: Affect appropriate to situation Eyes: No scleral injection HENT: No OP obstrucion Head: Normocephalic.  Cardiovascular: Normal rate and regular rhythm.  Respiratory: Effort normal and breath sounds normal to anterior ascultation GI: Soft.  No distension. There is  no tenderness.  Skin: WDI  Neuro: Mental Status: Patient is Obtunded, but does open eyes to stimulation. He does not follow commands. He does not currently fixate or track. Cranial Nerves: II: He does not blink to threat. Pupils are equal, round, and reactive to light.   III,IV, VI: No clear eye deviation V: VII: Blinks to eyelid stimulation bilaterally VIII, X, XI, XII: Unable to assess secondary to patient's altered mental status.  Motor: He clearly has a fairly dense right hemiparesis, but does withdraw to noxious stimuli in both the arm and leg. Sensory: He responds to noxious stimuli in all 4 extremities, but less so on the right Cerebellar: Does not perform    I have reviewed labs in epic and the results pertinent to this consultation are: Bmp - unremarkable  I have reviewed the images obtained:MRI brain - old L MCA territory infarction. No new findings.   Impression: 60 year old male with new onset seizures likely secondary to previous infarct. A clear evidence of infection (e.g. fevers, white count, etc.) And apparently he has had good compliance with HIV control. In people with structural brain disease, does not uncommon to have prolonged postictal periods, but the fact that he was following commands prior to his second seizure is reassuring. I will get a stat EEG to rule out ongoing seizure just to be safe, however.  Recommendations: 1) EEG 2) Keppra 1  g 1 then 500 mg twice a day 3) neurology will continue to follow.   Ritta SlotMcNeill Bentlee Benningfield, MD [facility] [phone]  If 7pm- 7am, please page neurology on call as listed in AMION.

## 2016-07-08 NOTE — ED Notes (Signed)
Pt returned from MRI and started having a bilateral fully body seizure lasting approx 2 minutes in length before returning to the room.  Sarita BottomNicole Piscotta, PA was at bedside.

## 2016-07-08 NOTE — Progress Notes (Signed)
STAT EEG completed; results pending. Dr Kirkpatrick notified. 

## 2016-07-08 NOTE — ED Triage Notes (Signed)
Pt to ED by EMS c/o possible seizure at home. Wife called EMS after pt "had a seizure and fell out of bed." Pt appears postictal on arrival, will follow commands, nonverbal. Tongue trauma noted with incontinence. C-collar placed by EMS.

## 2016-07-08 NOTE — ED Notes (Signed)
Attempted to obtain urine specimen; Pt unable to provide one at this time 

## 2016-07-08 NOTE — ED Notes (Signed)
Patient transported to MRI 

## 2016-07-08 NOTE — ED Provider Notes (Signed)
MSE was initiated and I personally evaluated the patient and placed orders (if any) at  6:02 AM on July 08, 2016.  The patient appears stable so that the remainder of the MSE may be completed by another provider.  Patient presents by EMS after reported seizure. No collateral information available. EMS reports wife reported seizure activity and patient falling out of bed. No known history of seizures. Chart review reveals history of HIV. Per EMS, he was altered and had notable incontinence and a tongue laceration.  He is noncontributory to history taking. He will respond to his name and follows simple commands but will not speak. Vital signs are reassuring. Lab work and CT head and neck obtained.   Shon Batonourtney F Horton, MD 07/08/16 [phone]

## 2016-07-08 NOTE — ED Notes (Signed)
EEG at bedside.

## 2016-07-08 NOTE — ED Notes (Signed)
RN and PA notified of decreased BP

## 2016-07-08 NOTE — ED Notes (Signed)
Wife at bedside now, woke up this morning to pt having full body convulsions then fell out of bed. Pt does not have hx of seizures. Hx of stroke in May with R hand deficits

## 2016-07-08 NOTE — ED Provider Notes (Signed)
[facility]-EMER[facility]ENCY DEPT Provider Note   CSN: [id number] Arrival date & time: 07/08/16  4098     History   Chief Complaint Chief Complaint  Patient presents with  . Seizures    HPI  Blood pressure 116/77, pulse 87, temperature 98.4 F (36.9 C), temperature source Oral, resp. rate 18, height 5\' 10"  (1.778 m), weight 72.6 kg, SpO2 95 %.  BRICK KETCHER is a 60 y.o. male in by EMS for seizure like activity, level V caveat secondary to altered mental status. History is supplied by wife. Past medical history significant for HIV, follows at the [place], unknown last CD4 count, also hepatitis C which is clearing and has been treated, recent stroke in May, no seizure history. Patient had tonic-clonic movement this morning, his wife was awakened to use the restroom and saw him shaking. Lasted for approximately 5 minutes. He was on the bed, she was concerned she was would fall off the bed and tried to pull him back on, 911 was called. Had been in his recent state of health with no fevers, vomiting, cough or other infectious prodrome. As per wife, he used to be a heavy drinker however he has not drank alcohol since his stroke in May she was out of town Monday through Thursday and [place], states that when she got home she found multiple large beer bottles in the trash. She has not drank in the last 48 hours.    HPI  Past Medical History:  Diagnosis Date  . HIV infection (HCC)   . Stroke [facility])     Patient Active Problem List   Diagnosis Date Noted  . Hepatitis C 07/08/2016  . HLD (hyperlipidemia) 07/08/2016  . Seizure (HCC) 07/08/2016  . HIV disease (HCC) 02/23/2016    Past Surgical History:  Procedure Laterality Date  . JOINT REPLACEMENT    . KNEE SURGERY    . KNEE SURGERY    . SHOULDR SURGERY         Home Medications    Prior to Admission medications   Medication Sig Start Date End Date Taking? Authorizing Provider  abacavir-dolutegravir-lamiVUDine (TRIUMEQ) 600-50-300 MG  tablet Take 1 tablet by mouth daily.   Yes Historical Provider, MD  aspirin 81 MG chewable tablet Chew 81 mg by mouth daily.   Yes Historical Provider, MD  atorvastatin (LIPITOR) 40 MG tablet Take 20 mg by mouth at bedtime.   Yes Historical Provider, MD  baclofen (LIORESAL) 10 MG tablet Take 10 mg by mouth 3 (three) times daily as needed for muscle spasms.   Yes Historical Provider, MD  docusate sodium (COLACE) 100 MG capsule Take 200 mg by mouth daily as needed (to prevent constipation).   Yes Historical Provider, MD  dolutegravir (TIVICAY) 50 MG tablet Take 50 mg by mouth at bedtime.   Yes Historical Provider, MD  ibuprofen (ADVIL,MOTRIN) 600 MG tablet Take 600 mg by mouth 3 (three) times daily as needed (pain and inflammation). Reported on 02/23/2016   Yes Historical Provider, MD  acetaminophen (TYLENOL) 500 MG tablet Take 500 mg by mouth every 6 (six) hours as needed for mild pain. Reported on 02/23/2016    Historical Provider, MD  cyclobenzaprine (FLEXERIL) 5 MG tablet Take 5 mg by mouth 3 (three) times daily as needed for muscle spasms. Reported on 02/23/2016    Historical Provider, MD  erythromycin ophthalmic ointment Place a 1/2 inch ribbon of ointment into the lower eyelid.  Apply to eyelid at bedtime Patient not taking: Reported on 02/23/2016 08/12/14  Heather Laisure, PA-C  gabapentin (NEURONTIN) 600 MG tablet Take 600 mg by mouth daily. Reported on 02/23/2016    Historical Provider, MD  prednisoLONE acetate (PRED FORTE) 1 % ophthalmic suspension Place 2 drops into both eyes 2 (two) times daily. Patient not taking: Reported on 02/23/2016 08/12/14   Santiago GladHeather Laisure, PA-C  traMADol (ULTRAM) 50 MG tablet Take 50 mg by mouth every 6 (six) hours as needed for moderate pain. Reported on 02/23/2016    Historical Provider, MD    Family History Family History  Problem Relation Age of Onset  . Diabetes Mother   . Heart disease Mother   . Diabetes Father   . Heart disease Father     Social  History Social History  Substance Use Topics  . Smoking status: Current Every Day Smoker  . Smokeless tobacco: Not on file  . Alcohol use Yes     Comment: one everyday     Allergies   Review of patient's allergies indicates no known allergies.   Review of Systems Review of Systems  Unable to perform ROS: Mental status change     Physical Exam Updated Vital Signs BP 138/74 (BP Location: Right Arm)   Pulse 92   Temp 97 F (36.1 C) (Rectal)   Resp 15   Ht 5\' 10"  (1.778 m)   Wt 72.6 kg   SpO2 100%   BMI 22.96 kg/m   Physical Exam  Constitutional: He appears well-developed and well-nourished. No distress.  HENT:  Head: Normocephalic.  Mouth/Throat: Oropharynx is clear and moist.  Laceration to tongue, no active bleeding. No head contusions or crepitance, no abnormal otorrhea or rhinorrhea. Pupils are equal, round and reactive to light.  Eyes: Conjunctivae and EOM are normal. Pupils are equal, round, and reactive to light.  Neck:  C-collar in place.  Cardiovascular: Normal rate, regular rhythm and intact distal pulses.   Pulmonary/Chest: Effort normal and breath sounds normal.  Abdominal: Soft. There is no tenderness.  Neurological:  Will not open eyes, withdraws to pain, follow simple commands   GCS9 E1V2M6    + Gag reflex  Skin: He is not diaphoretic.  Psychiatric: He has a normal mood and affect.  Nursing note and vitals reviewed.    ED Treatments / Results  Labs (all labs ordered are listed, but only abnormal results are displayed) Labs Reviewed  BASIC METABOLIC PANEL - Abnormal; Notable for the following:       Result Value   CO2 14 (*)    Glucose, Bld 129 (*)    Anion gap 16 (*)    All other components within normal limits  CBG MONITORING, ED - Abnormal; Notable for the following:    Glucose-Capillary 116 (*)    All other components within normal limits  CBC  ETHANOL  URINALYSIS, ROUTINE W REFLEX MICROSCOPIC (NOT AT [facility])  URINE RAPID DRUG  SCREEN, HOSP PERFORMED    EKG  EKG Interpretation  Date/Time:  Saturday July 08 2016 05:41:13 EDT Ventricular Rate:  91 PR Interval:    QRS Duration: 93 QT Interval:  381 QTC Calculation: 469 R Axis:   33 Text Interpretation:  Sinus rhythm Ventricular premature complex LAE, consider biatrial enlargement Confirmed by Wilkie AyeHORTON  MD, COURTNEY ([id number]) on 07/08/2016 6:17:22 AM       Radiology Ct Head Wo Contrast  Result Date: 07/08/2016 CLINICAL DATA:  Seizure and fall. History of HIV. Incontinence and time laceration. Altered mental status. EXAM: CT HEAD WITHOUT CONTRAST CT CERVICAL SPINE WITHOUT CONTRAST  TECHNIQUE: Multidetector CT imaging of the head and cervical spine was performed following the standard protocol without intravenous contrast. Multiplanar CT image reconstructions of the cervical spine were also generated. COMPARISON:  04/23/2008 FINDINGS: CT HEAD FINDINGS Brain: Focal area of low attenuation demonstrated in the left posterior frontal lobe and frontal white matter. This was not present previously and could represent ischemia or mass lesion. MRI suggested for further evaluation. There is no significant mass effect or midline shift. Ventricles are not dilated. No abnormal extra-axial fluid collections. Gray-white matter junctions are distinct. Basal cisterns are not effaced. No acute intracranial hemorrhage. Vascular: No hyperdense vessel or unexpected calcification. Skull: Normal. Negative for fracture or focal lesion. Sinuses/Orbits: No acute finding. Other: None. CT CERVICAL SPINE FINDINGS Alignment: Normal. Skull base and vertebrae: No acute fracture. No primary bone lesion or focal pathologic process. Soft tissues and spinal canal: No prevertebral fluid or swelling. No visible canal hematoma. Disc levels: Degenerative changes with narrowed disc spaces and endplate hypertrophic changes at C3-4, C4-5, and C5-6 levels. Prominent posterior osteophytes or disc osteophyte complexes  demonstrated at C3-4 and C5-6 which cause some encroachment upon the central canal. Upper chest: Hazy opacities in the upper lungs could be due to motion artifact or may indicate edema. Calcified granulomas in the left upper lung. Aortic calcification. Other: Vascular calcifications in the cervical carotid arteries. IMPRESSION: Grade low-attenuation or encephalomalacia in the left posterior frontal lobe and frontal white matter, new since prior study. This could represent ischemia, infarct, or mass lesion. MRI suggested for further evaluation. No significant mass effect or midline shift. No acute intracranial hemorrhage. Normal alignment of the cervical spine. Degenerative changes are present. No acute displaced fractures identified. Electronically Signed   By: Burman Nieves M.D.   On: 07/08/2016 06:35   Ct Cervical Spine Wo Contrast  Result Date: 07/08/2016 CLINICAL DATA:  Seizure and fall. History of HIV. Incontinence and time laceration. Altered mental status. EXAM: CT HEAD WITHOUT CONTRAST CT CERVICAL SPINE WITHOUT CONTRAST TECHNIQUE: Multidetector CT imaging of the head and cervical spine was performed following the standard protocol without intravenous contrast. Multiplanar CT image reconstructions of the cervical spine were also generated. COMPARISON:  04/23/2008 FINDINGS: CT HEAD FINDINGS Brain: Focal area of low attenuation demonstrated in the left posterior frontal lobe and frontal white matter. This was not present previously and could represent ischemia or mass lesion. MRI suggested for further evaluation. There is no significant mass effect or midline shift. Ventricles are not dilated. No abnormal extra-axial fluid collections. Gray-white matter junctions are distinct. Basal cisterns are not effaced. No acute intracranial hemorrhage. Vascular: No hyperdense vessel or unexpected calcification. Skull: Normal. Negative for fracture or focal lesion. Sinuses/Orbits: No acute finding. Other: None. CT  CERVICAL SPINE FINDINGS Alignment: Normal. Skull base and vertebrae: No acute fracture. No primary bone lesion or focal pathologic process. Soft tissues and spinal canal: No prevertebral fluid or swelling. No visible canal hematoma. Disc levels: Degenerative changes with narrowed disc spaces and endplate hypertrophic changes at C3-4, C4-5, and C5-6 levels. Prominent posterior osteophytes or disc osteophyte complexes demonstrated at C3-4 and C5-6 which cause some encroachment upon the central canal. Upper chest: Hazy opacities in the upper lungs could be due to motion artifact or may indicate edema. Calcified granulomas in the left upper lung. Aortic calcification. Other: Vascular calcifications in the cervical carotid arteries. IMPRESSION: Grade low-attenuation or encephalomalacia in the left posterior frontal lobe and frontal white matter, new since prior study. This could represent ischemia, infarct, or  mass lesion. MRI suggested for further evaluation. No significant mass effect or midline shift. No acute intracranial hemorrhage. Normal alignment of the cervical spine. Degenerative changes are present. No acute displaced fractures identified. Electronically Signed   By: Burman Nieves M.D.   On: 07/08/2016 06:35   Mr Brain W Or Wo Contrast  Result Date: 07/08/2016 CLINICAL DATA:  Seizure and fall. Abnormal head CT. Altered mental status. History of HIV. EXAM: MRI HEAD WITHOUT AND WITH CONTRAST TECHNIQUE: Multiplanar, multiecho pulse sequences of the brain and surrounding structures were obtained without and with intravenous contrast. CONTRAST:  15mL MULTIHANCE GADOBENATE DIMEGLUMINE 529 MG/ML IV SOLN COMPARISON:  Head CT 07/08/2016 FINDINGS: Brain: Dedicated thin section imaging through the temporal lobes demonstrates symmetric volume and normal signal of the hippocampi. There is no evidence of acute infarct, intracranial hemorrhage, mass, midline shift, or extra-axial fluid collection. There is a  moderate-sized region of encephalomalacia in the left frontal lobe predominantly involving the operculum as well as insula consistent with chronic MCA infarct. There is left-sided corticospinal tract wallerian degeneration. Mild global cerebral atrophy is noted. No abnormal enhancement is identified. Small foci of T2 hyperintensity scattered throughout the cerebral white matter bilaterally are nonspecific but may reflect mild chronic small vessel ischemic disease. Vascular: Major intracranial vascular flow voids are preserved. Skull and upper cervical spine: No suspicious osseous lesion. Mild cervical spondylosis partially visualized. Sinuses/Orbits: Unremarkable. Other: None. IMPRESSION: 1. No acute intracranial abnormality. 2. Chronic left MCA infarct.  This could serve as a seizure focus. 3. Mild chronic small vessel ischemic type changes in the cerebral white matter. Electronically Signed   By: Sebastian Ache M.D.   On: 07/08/2016 08:53    Procedures Procedures (including critical care time)  Medications Ordered in ED Medications  Tdap (BOOSTRIX) injection 0.5 mL (not administered)  gadobenate dimeglumine (MULTIHANCE) injection 15 mL (15 mLs Intravenous Contrast Given 07/08/16 0829)  LORazepam (ATIVAN) injection 2 mg (2 mg Intravenous Given 07/08/16 0836)  levETIRAcetam (KEPPRA) 1,000 mg in sodium chloride 0.9 % 100 mL IVPB (0 mg Intravenous Stopped 07/08/16 0916)  sodium chloride 0.9 % bolus 1,000 mL (1,000 mLs Intravenous New Bag/Given 07/08/16 0903)     Initial Impression / Assessment and Plan / ED Course  I have reviewed the triage vital signs and the nursing notes.  Pertinent labs & imaging results that were available during my care of the patient were reviewed by me and considered in my medical decision making (see chart for details).  Clinical Course   Vitals:   07/08/16 0915 07/08/16 0920 07/08/16 0930 07/08/16 1001  BP: 129/77  127/67 138/74  Pulse: 104  99 92  Resp: 16  17 15     Temp:  97 F (36.1 C)    TempSrc:  Rectal    SpO2: 100%  100% 100%  Weight:      Height:        Medications  Tdap (BOOSTRIX) injection 0.5 mL (not administered)  gadobenate dimeglumine (MULTIHANCE) injection 15 mL (15 mLs Intravenous Contrast Given 07/08/16 0829)  LORazepam (ATIVAN) injection 2 mg (2 mg Intravenous Given 07/08/16 0836)  levETIRAcetam (KEPPRA) 1,000 mg in sodium chloride 0.9 % 100 mL IVPB (0 mg Intravenous Stopped 07/08/16 0916)  sodium chloride 0.9 % bolus 1,000 mL (1,000 mLs Intravenous New Bag/Given 07/08/16 4098)    CHRISTINA WALDROP is 60 y.o. male presenting with First seizure like activity, patient has history of HIV, he had a stroke in May, unfortunately, we do not have these  [facility]. Patient is not back to baseline, responds to painful stimulus.  8:35 AM: Patient is being wheeled back from MRI and he is currently having a seizure, tonic-clonic movement to the upper extremities, eyes rolling back in his head. 2 mg of Ativan is given IV, patient is suctioned and oxygen 5 L via nasal cannula is applied. Will be loaded with Keppra. Wife states that before he went to MRI, she communicated with him verbally that he had a seizure, he was able to communicate back via head shakes no, he had never regained baseline mental status, therefore status epilepticus. 8:40 AM: Seizures terminated, O2 reduced to 2 L by nasal cannula, saturating 95%.   Dr. Manus Gunning for has evaluated the patient, does not think he needs to be intubated, no neck stiffness, no indication for LP at this time. Patient became hypotensive with systolic in the upper 70s low 09W. Second IV is given, patient is bolused. Likely response to Ativan.  MRI is read as chronic left MCA infarct.  Neurology consult from Dr. Amada Jupiter appreciated: Has evaluated the patient and will expedite EEG. States that it can take patients with encephalocele ratio secondary to seizure  and extended period of time to return to baseline.  Records obtained from the [place] which shows patient with a last CD4 count of 490 04/17/2016 however he had a detectable viral load in 5 of 17, this was concerning to them for partial compliance with his antiretroviral medications. He also has a history of polysubstance abuse including cocaine, tobacco and ethanol.  Unassigned admission to internal medicine teaching service, discussed with resident Dr. Gara Kroner accepts admission for attending Dr. Drue Second.  Called to the bedside because patient was becoming agitated, could not cooperate with the EEG because of movement. Patient is given a urinal and he calmed down immediately and became cooperative  Final Clinical Impressions(s) / ED Diagnoses   Final diagnoses:  Seizure [facility])    New Prescriptions New Prescriptions   No medications on file     Wynetta Emery, PA-C 07/08/16 1046    Glynn Octave, MD 07/08/16 [phone]

## 2016-07-08 NOTE — ED Notes (Signed)
[place] PA at bedside.

## 2016-07-08 NOTE — Procedures (Signed)
History: 60 year old male with recurrent seizures this morning  Sedation: Received Ativan earlier  Technique: This is a 21 channel routine scalp EEG performed at the bedside with bipolar and monopolar montages arranged in accordance to the international 10/20 system of electrode placement. One channel was dedicated to EKG recording.    Background: The background consists of intermixed alpha and beta activities. There is a well defined posterior dominant rhythm of 8 Hz. Sleep is recorded with normal appearing structures. There is mild generalized irregular delta activity even during maximal wakefulness, and there is some suggestion of a left-sided predominance though this is not definite by this study.  Photic stimulation: Physiologic driving is not performed  EEG Abnormalities: 1) generalized irregular delta activity  Clinical Interpretation: This EEG is consistent with a mild generalized nonspecific cerebral dysfunction (encephalopathy). This is nonspecific but can be seen in a postictal state among other etiologies. There is no evidence of ongoing seizure or definite evidence of seizure predisposition on this study.  Ritta SlotMcNeill Kasheem Toner, MD [facility] [phone]  If 7pm- 7am, please page neurology on call as listed in AMION.

## 2016-07-08 NOTE — ED Notes (Signed)
[facility] given to [place].

## 2016-07-09 DIAGNOSIS — R569 Unspecified convulsions: Secondary | ICD-10-CM | POA: Diagnosis not present

## 2016-07-09 DIAGNOSIS — F1910 Other psychoactive substance abuse, uncomplicated: Secondary | ICD-10-CM | POA: Diagnosis not present

## 2016-07-09 DIAGNOSIS — B20 Human immunodeficiency virus [HIV] disease: Secondary | ICD-10-CM | POA: Diagnosis not present

## 2016-07-09 DIAGNOSIS — I82409 Acute embolism and thrombosis of unspecified deep veins of unspecified lower extremity: Secondary | ICD-10-CM | POA: Diagnosis not present

## 2016-07-09 LAB — BASIC METABOLIC PANEL
ANION GAP: 8 (ref 5–15)
BUN: 9 mg/dL (ref 6–20)
CO2: 24 mmol/L (ref 22–32)
Calcium: 9.1 mg/dL (ref 8.9–10.3)
Chloride: 110 mmol/L (ref 101–111)
Creatinine, Ser: 1.1 mg/dL (ref 0.61–1.24)
GFR calc Af Amer: >60 mL/min (ref >60)
GFR calc non Af Amer: >60 mL/min (ref >60)
GLUCOSE: 101 mg/dL — AB (ref 65–99)
POTASSIUM: 3.2 mmol/L — AB (ref 3.5–5.1)
Sodium: 142 mmol/L (ref 135–145)

## 2016-07-09 LAB — HIV-1 RNA QUANT-NO REFLEX-BLD
HIV 1 RNA Quant: 80 copies/mL
LOG10 HIV-1 RNA: 1.903 log10copy/mL

## 2016-07-09 MED ORDER — LEVETIRACETAM 500 MG PO TABS
500.0000 mg | ORAL_TABLET | Freq: Two times a day (BID) | ORAL | Status: DC
  Administered 2016-07-09 – 2016-07-10 (×3): 500 mg via ORAL
  Filled 2016-07-09 (×3): qty 1

## 2016-07-09 MED ORDER — ABACAVIR-DOLUTEGRAVIR-LAMIVUD 600-50-300 MG PO TABS
1.0000 | ORAL_TABLET | Freq: Every day | ORAL | Status: DC
  Administered 2016-07-09 – 2016-07-10 (×2): 1 via ORAL
  Filled 2016-07-09 (×2): qty 1

## 2016-07-09 MED ORDER — ASPIRIN 81 MG PO CHEW
81.0000 mg | CHEWABLE_TABLET | Freq: Every day | ORAL | Status: DC
  Administered 2016-07-09 – 2016-07-10 (×2): 81 mg via ORAL
  Filled 2016-07-09 (×2): qty 1

## 2016-07-09 MED ORDER — DOLUTEGRAVIR SODIUM 50 MG PO TABS
50.0000 mg | ORAL_TABLET | Freq: Every day | ORAL | Status: DC
  Administered 2016-07-09 – 2016-07-10 (×2): 50 mg via ORAL
  Filled 2016-07-09 (×2): qty 1

## 2016-07-09 MED ORDER — ORAL CARE MOUTH RINSE
15.0000 mL | Freq: Two times a day (BID) | OROMUCOSAL | Status: DC
  Administered 2016-07-09 – 2016-07-10 (×4): 15 mL via OROMUCOSAL

## 2016-07-09 MED ORDER — POTASSIUM CHLORIDE CRYS ER 20 MEQ PO TBCR: 40.0000 meq | EXTENDED_RELEASE_TABLET | Freq: Two times a day (BID) | ORAL | Status: DC

## 2016-07-09 MED ORDER — AMLODIPINE BESYLATE 10 MG PO TABS
10.0000 mg | ORAL_TABLET | Freq: Every day | ORAL | Status: DC
  Administered 2016-07-09 – 2016-07-10 (×2): 10 mg via ORAL
  Filled 2016-07-09 (×2): qty 1

## 2016-07-09 MED ORDER — ATORVASTATIN CALCIUM 20 MG PO TABS
20.0000 mg | ORAL_TABLET | Freq: Every day | ORAL | Status: DC
  Administered 2016-07-10: 20 mg via ORAL
  Filled 2016-07-09: qty 1

## 2016-07-09 MED ORDER — POTASSIUM CHLORIDE CRYS ER 20 MEQ PO TBCR
40.0000 meq | EXTENDED_RELEASE_TABLET | Freq: Once | ORAL | Status: AC
  Administered 2016-07-09: 40 meq via ORAL
  Filled 2016-07-09: qty 2

## 2016-07-09 NOTE — Evaluation (Signed)
Clinical/Bedside Swallow Evaluation Patient Details  Name: Adam Richardson MRN: [id number] Date of Birth: Feb 14, 1956  Today's Date: 07/09/2016 Time: SLP Start Time (ACUTE ONLY): 1520 SLP Stop Time (ACUTE ONLY): 1533 SLP Time Calculation (min) (ACUTE ONLY): 13 min  Past Medical History:  Past Medical History:  Diagnosis Date  . HIV infection (HCC)   . Stroke [facility](HCC)    Past Surgical History:  Past Surgical History:  Procedure Laterality Date  . JOINT REPLACEMENT    . KNEE SURGERY    . KNEE SURGERY    . 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 A second episode occurred,witness by staff in the ED. He was loaded with keppra, and given prn ativan for seizure management. MRI of brain is normal. No recent alcohol use to suggest alcohol withdrawal.   Assessment / Plan / Recommendation Clinical Impression  Swallowing function appearing WFL. Swift oral transit of bolus and no overt s/s of aspiration. Patient with c/o mild lingual pain with mastication due to biting anterior tip of tongue during seizure. Recommend initiating a dysphagia 3 diet with upgrade as patient desires. No SLP f/u indicated.     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 A second episode occurred,witness by staff in the ED. He was loaded with keppra, and given prn ativan for seizure management. MRI of brain is normal. No recent alcohol use to suggest alcohol withdrawal. Type of Study: Bedside Swallow Evaluation Previous Swallow Assessment: none noted Diet Prior to this Study: NPO Temperature Spikes Noted: Yes Respiratory Status: Room air History of Recent Intubation: No Behavior/Cognition: Alert;Cooperative;Pleasant mood (APHASIC) Oral Cavity Assessment: Other (comment) (anterior tongue bruising, redness (bit during seizure)) Oral Care Completed by SLP: No Oral Cavity - Dentition: Adequate natural dentition Vision: Functional for self-feeding Self-Feeding Abilities: Able to feed self Patient Positioning: Upright in bed Baseline Vocal Quality: Normal Volitional Cough: Cognitively unable to elicit Volitional Swallow: Able to elicit    Oral/Motor/Sensory Function Overall Oral Motor/Sensory Function: Other (comment) (oral apraxia, 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)   Ice Chips Ice chips: Not tested   Thin Liquid Thin Liquid: Within functional limits Presentation: Cup;Self Fed;Straw    Nectar Thick Nectar Thick Liquid: Not tested   Honey Thick Honey Thick Liquid: Not tested   Puree Puree: Within functional limits Presentation: Self Fed;Spoon   Solid   GO   Solid: Within functional limits Presentation: Self Fed    Functional Assessment Tool Used: skilled clinical judgement Functional Limitations: Swallowing Swallow Current Status ([id number](G8996): 0 percent impaired, limited or restricted Swallow Goal Status ([id number](G8997): 0 percent impaired, limited or restricted Swallow Discharge Status [phone](G8998): 0 percent impaired, limited or restricted  Ferdinand LangoLeah Yu Cragun MA, CCC-SLP [phone]  Adam Richardson 07/09/2016,3:36 PM

## 2016-07-09 NOTE — Progress Notes (Signed)
The patient has markedly improved overnight. He continues to have a right hemiparesis and mild aphasia which is baseline for the patient. He will need to be on antiepileptic therapy from here on out, given that he has a clear seizure focus.  Also, tramadol can lower seizure threshold and I would advise avoiding this in seizure patients.  1) continue Keppra 500 mg twice a day 2) no other recommendations at this time, neurology will sign off please call with further questions or concerns.  Adam SlotMcNeill Kenady Doxtater, MD [facility] [phone]  If 7pm- 7am, please page neurology on call as listed in AMION.

## 2016-07-09 NOTE — Progress Notes (Signed)
   Subjective: No acute events overnight. Patient's mental status has improved. He did not complain of pain this morning. He has difficulty with speech at baseline secondary to his recent MCA stroke. He had no complaints this morning.  Objective:  Vital signs in last 24 hours: Vitals:   07/09/16 0600 07/09/16 0700 07/09/16 0800 07/09/16 1100  BP: 140/76 134/82 (!) 143/79 (!) 159/91  Pulse: 66 71 73 85  Resp: 16 15 17 20   Temp:  98.1 F (36.7 C)  99.1 F (37.3 C)  TempSrc:  Oral  Oral  SpO2: 97% 99% 98% 96%  Weight:      Height:       Physical Exam  Constitutional: He appears well-developed and well-nourished.  HENT:  Head: Normocephalic and atraumatic.  Extremely poor oral dentition, gingivitis, medial anterior tongue laceration  Cardiovascular: Normal rate and regular rhythm.  Exam reveals no gallop and no friction rub.   No murmur heard. Respiratory: Effort normal and breath sounds normal. No respiratory distress. He has no wheezes.  GI: Soft. Bowel sounds are normal. He exhibits no distension. There is no tenderness.  Bowel sounds normal, no abdominal or renal bruits auscultated.  Musculoskeletal: He exhibits no edema.  Neurological:  Patient was alert this morning and interactive with conversation. He has baseline dysarthric speech secondary to recent MCA stroke.  Skin: No erythema.     Assessment/Plan: [name] is a 60 year old African-American male with a past medical history of HIV, polysubstance abuse, hepatitis C and previous left MCA stroke who presents with a one-day history of seizures.  1. Seizures Patient presented with a seizure at home and a single witnessed seizure in the emergency department. It is unclear if he has had seizures in the past. Currently, he has no evidence of infection. His urine drug screen was negative. MRI of the brain demonstrates chronic left MCA infarct without acute intracranial abnormality. The patient's previous left MCA infarct  may be acting as an epileptogenic focus and may be the etiology of the patient's seizures. -- Keppra 1000 mg 1 then 500 mg twice a day -- Neurology consulted -- EEG-  no evidence of ongoing seizure or definite evidence of seizure predisposition -- Would appreciate neurology recommendations   2. Polysubstance abuse Patient with known polysubstance abuse including tobacco, cocaine and alcohol. UDS negative. -- CIWA protocol -- Folic acid, thiamine  3. HIV Patient with a history of HIV infection. Most recent CD4 count on 12/2015 of 497. Patient currently nothing by mouth secondary to seizure activity. -- Restart HIV medications when tolerating PO -- Home Regimen (abacavir/dolutegravir/lamivudine -- Follow-up CD4 count and viral load  4. Hypertension -- Started amlodipine 10 mg once daily  5. DVT/PE prophylaxis -- Lovenox 40 mg subcutaneous injection once daily  Dispo: Anticipated discharge in approximately 1 day(s).   Adam LotJames Daziah Hesler, MD 07/09/2016, 12:11 PM Pager: [phone]

## 2016-07-10 DIAGNOSIS — B1920 Unspecified viral hepatitis C without hepatic coma: Secondary | ICD-10-CM | POA: Diagnosis not present

## 2016-07-10 DIAGNOSIS — B20 Human immunodeficiency virus [HIV] disease: Secondary | ICD-10-CM | POA: Diagnosis not present

## 2016-07-10 DIAGNOSIS — I10 Essential (primary) hypertension: Secondary | ICD-10-CM | POA: Diagnosis not present

## 2016-07-10 DIAGNOSIS — G40909 Epilepsy, unspecified, not intractable, without status epilepticus: Secondary | ICD-10-CM | POA: Diagnosis not present

## 2016-07-10 DIAGNOSIS — F1910 Other psychoactive substance abuse, uncomplicated: Secondary | ICD-10-CM | POA: Diagnosis not present

## 2016-07-10 DIAGNOSIS — I82409 Acute embolism and thrombosis of unspecified deep veins of unspecified lower extremity: Secondary | ICD-10-CM | POA: Diagnosis not present

## 2016-07-10 DIAGNOSIS — R569 Unspecified convulsions: Secondary | ICD-10-CM | POA: Diagnosis not present

## 2016-07-10 LAB — T-HELPER CELLS (CD4) COUNT (NOT AT ARMC)
CD4 % Helper T Cell: 28 % — ABNORMAL LOW (ref 33–55)
CD4 T CELL ABS: 350 /uL — AB (ref 400–2700)

## 2016-07-10 MED ORDER — POTASSIUM CHLORIDE CRYS ER 20 MEQ PO TBCR
40.0000 meq | EXTENDED_RELEASE_TABLET | Freq: Once | ORAL | Status: AC
  Administered 2016-07-10: 40 meq via ORAL
  Filled 2016-07-10: qty 2

## 2016-07-10 MED ORDER — GI COCKTAIL ~~LOC~~
30.0000 mL | Freq: Once | ORAL | Status: AC
  Administered 2016-07-10: 30 mL via ORAL
  Filled 2016-07-10: qty 30

## 2016-07-10 MED ORDER — LEVETIRACETAM 500 MG PO TABS: 500.0000 mg | ORAL_TABLET | Freq: Two times a day (BID) | ORAL | 3 refills | Status: DC

## 2016-07-10 MED ORDER — LEVETIRACETAM 500 MG PO TABS: 500.0000 mg | ORAL_TABLET | Freq: Two times a day (BID) | ORAL | 3 refills | Status: AC

## 2016-07-10 NOTE — Care Management Note (Addendum)
Case Management Note  Patient Details  Name: Adam Richardson MRN: [id number] Date of Birth: [birth year]  Subjective/Objective:  Patient lives with spouse, he has a cane at home that he does not use.  Wife states he is active with [facility] for ST [place] now [place] [facility] [place] and she just received an approval letter for physical [place] from the [place], she will need to show this to  The Speech [facility] when they come out to see patient..  Patient has PCP [name], [facility]  NCM notified [name] with [facility] for HHST and HHPT.  Patient is for possible discharge today.  NCM will cont to follow for dc needs.                   Action/Plan:   Expected Discharge Date:  07/14/16               Expected Discharge Plan:  Home w Home Health Services  In-House Referral:     Discharge planning Services  CM Consult  Post Acute Care Choice:  Home Health, Resumption of Svcs/PTA Provider Choice offered to:  Patient, Spouse  DME Arranged:    DME Agency:     HH Arranged:  PT, Speech Therapy HH Agency:  [facility]  Status of Service:  Completed, signed off  If discussed at Long Length of Stay Meetings, dates discussed:    Additional Comments:  Leone Havenaylor, Brycelynn Stampley Clinton, RN 07/10/2016, 2:14 PM

## 2016-07-10 NOTE — Progress Notes (Signed)
Printed discharge instructions reviewed with patient's wife.  Patient prepared for d/c via wheelchair.

## 2016-07-10 NOTE — Evaluation (Signed)
Physical Therapy Evaluation Patient Details Name: Adam Richardson MRN: [id number] DOB: 1956-09-01 Today's Date: 07/10/2016   History of Present Illness  60 y.o. male admitted to [facility] on 07/08/16 for seizure.  Pt with significant PMHx of stroke with right sided weakness, HIV, L TKA, and shoulder surgery.   Clinical Impression  Pt was able to ambulate around the unit with RW.  He needed min hand held assist without RW and wife reports at baseline she has been trying to get him to use a cane (but he often refuses).  I agree, that at the very least he needs something for balance and stability both because of his right leg weakness and his left knee (had TKA 30 years ago and likely needs another one).  Pt is safe to return home with his wife's assist, however, he would benefit from HHPT f/u as he is weaker than his baseline and at greater risk for falls (especially he he doesn't heed my/his wife's advice to use a cane).   PT to follow acutely for deficits listed below.     Follow Up Recommendations Home health PT;Supervision for mobility/OOB    Equipment Recommendations  None recommended by PT    Recommendations for Other Services   NA    Precautions / Restrictions Precautions Precautions: Fall Precaution Comments: pt, at baseline is unsteady on his feet.  Wife wants him to use a cane, but he is "stubborn" and often refuses.       Mobility  Bed Mobility Overal bed mobility: Needs Assistance Bed Mobility: Supine to Sit     Supine to sit: Min assist     General bed mobility comments: min hand held assist to pull up to sitting from mildlly elevated HOB.   Transfers Overall transfer level: Needs assistance Equipment used: Rolling walker (2 wheeled);1 person hand held assist Transfers: Sit to/from Stand Sit to Stand: Min assist         General transfer comment: Min hand held assist to get to standing without RW, min assist to help pt lower to sitting in a more controlled fashion  with RW.  Pt with uncontrolled descent to sit.   Ambulation/Gait Ambulation/Gait assistance: Min assist Ambulation Distance (Feet): 300 Feet Assistive device: Rolling walker (2 wheeled);1 person hand held assist Gait Pattern/deviations: Step-through pattern;Antalgic;Trunk flexed Gait velocity: decreased Gait velocity interpretation: Below normal speed for age/gender General Gait Details: Pt started with min hand held assist, was reaching out to IV pole in addition to therapist's hand for support, so switched to RW.  Pt much more confident and neeeded much less assist with RW.  Verbal cues for upright posture and to stay inside of RW while turning.  Pt tended to run into things on his right side.   Stairs Stairs: Yes Stairs assistance: Min assist Stair Management: One rail Left;Step to pattern;Forwards Number of Stairs: 3 (limited due to IV line) General stair comments: Pt was able to manage steps with step to pattern, min assist for safety and balance, pt lead down with his left leg and up with his right leg (likely because of his bad left knee), but per pt report it just depends on the day which leg he chooses due to pain or fatigue.          Balance Overall balance assessment: Needs assistance Sitting-balance support: Feet supported;No upper extremity supported Sitting balance-Leahy Scale: Good     Standing balance support: Single extremity supported Standing balance-Leahy Scale: Fair  Pertinent Vitals/Pain Pain Assessment: No/denies pain    Home Living Family/patient expects to be discharged to:: Private residence Living Arrangements: Spouse/significant other Available Help at Discharge: Family;Available 24 hours/day Type of Home: House Home Access: Stairs to enter Entrance Stairs-Rails: Doctor, general practice of Steps: 8 Home Layout: One level Home Equipment: Walker - 2 wheels;Cane - single point      Prior  Function Level of Independence: Independent         Comments: per wife, she would prefer if he would use a cane both because of the stroke and his bad left knee, but he often refuses.      Hand Dominance   Dominant Hand: Right    Extremity/Trunk Assessment   Upper Extremity Assessment: RUE deficits/detail RUE Deficits / Details: weak grip, elbow and shoulder compared to left side.  Unable to lift right arm above shoulder level against gravity.          Lower Extremity Assessment: RLE deficits/detail;LLE deficits/detail RLE Deficits / Details: right leg with slow motor response, but once he does fire it, he has at least 4/5 throughout on this leg.   LLE Deficits / Details: Left leg with h/o TKA in the 70s.  Per wife, he is in need of a new replacement, he is very genu varum at this knee joing and seems to have some difficulty with pain here as well (per pt report it is not every day).   Cervical / Trunk Assessment: Other exceptions  Communication   Communication: Expressive difficulties  Cognition Arousal/Alertness: Awake/alert Behavior During Therapy: WFL for tasks assessed/performed Overall Cognitive Status: History of cognitive impairments - at baseline                             Assessment/Plan    PT Assessment Patient needs continued PT services  PT Problem List Decreased strength;Decreased activity tolerance;Decreased balance;Decreased mobility;Decreased knowledge of use of DME          PT Treatment Interventions DME instruction;Gait training;Stair training;Functional mobility training;Therapeutic activities;Therapeutic exercise;Balance training;Neuromuscular re-education;Cognitive remediation;Patient/family education    PT Goals (Current goals can be found in the Care Plan section)  Acute Rehab PT Goals Patient Stated Goal: to go home, wife would like him to get more therapy than he had before PT Goal Formulation: With patient/family Time For Goal  Achievement: 07/24/16 Potential to Achieve Goals: Good    Frequency Min 3X/week           End of Session Equipment Utilized During Treatment: Gait belt Activity Tolerance: Patient tolerated treatment well Patient left: in chair;with call bell/phone within reach;with family/visitor present      Functional Assessment Tool Used: assist level Functional Limitation: Mobility: Walking and moving around Mobility: Walking and Moving Around Current Status [phone]): At least 20 percent but less than 40 percent impaired, limited or restricted Mobility: Walking and Moving Around Goal Status [phone]): At least 1 percent but less than 20 percent impaired, limited or restricted    Time: 0981-1914 PT Time Calculation (min) (ACUTE ONLY): 26 min   Charges:   PT Evaluation $PT Eval Moderate Complexity: 1 Procedure PT Treatments $Gait Training: 8-22 mins   PT G Codes:   PT G-Codes **NOT FOR INPATIENT CLASS** Functional Assessment Tool Used: assist level Functional Limitation: Mobility: Walking and moving around Mobility: Walking and Moving Around Current Status ([id number]): At least 20 percent but less than 40 percent impaired, limited or restricted Mobility: Walking and Moving Around  Goal Status [phone](G8979): At least 1 percent but less than 20 percent impaired, limited or restricted    Tamma Brigandi B. Zali Kamaka, PT, DPT [id number]#[phone]   07/10/2016, 2:08 PM

## 2016-07-10 NOTE — Discharge Summary (Signed)
Name: Adam Richardson MRN: [id number] DOB: 1956-08-15 60 y.o. PCP: Konrad FelixJacqueline C Raji, NP  Date of Admission: 07/08/2016  5:37 AM Date of Discharge: 07/10/2016 Attending Physician: Judyann Munsonynthia Snider, MD  Discharge Diagnosis: 1. Seizure   Discharge Medications:   Medication List    STOP taking these medications   ibuprofen 600 MG tablet Commonly known as:  ADVIL,MOTRIN   traMADol 50 MG tablet Commonly known as:  ULTRAM     TAKE these medications   amLODipine 10 MG tablet Commonly known as:  NORVASC Take 5 mg by mouth daily.   aspirin 81 MG chewable tablet Chew 81 mg by mouth daily.   atorvastatin 40 MG tablet Commonly known as:  LIPITOR Take 20 mg by mouth at bedtime.   baclofen 10 MG tablet Commonly known as:  LIORESAL Take 10 mg by mouth 3 (three) times daily as needed for muscle spasms.   docusate sodium 100 MG capsule Commonly known as:  COLACE Take 200 mg by mouth daily as needed (to prevent constipation).   erythromycin ophthalmic ointment Place a 1/2 inch ribbon of ointment into the lower eyelid.  Apply to eyelid at bedtime   levETIRAcetam 500 MG tablet Commonly known as:  KEPPRA Take 1 tablet (500 mg total) by mouth 2 (two) times daily.   multivitamin with minerals Tabs tablet Take 1 tablet by mouth daily.   TIVICAY 50 MG tablet Generic drug:  dolutegravir Take 50 mg by mouth at bedtime.   TRIUMEQ 600-50-300 MG tablet Generic drug:  abacavir-dolutegravir-lamiVUDine Take 1 tablet by mouth daily.   ZEPATIER 50-100 MG Tabs Generic drug:  Elbasvir-Grazoprevir Take 1 tablet by mouth daily. 12 week course for Hep C treatment to end on 07/25/16       Disposition and follow-up:   Mr.Adam Richardson [facility] in Good condition.  At the hospital follow up visit please address:  1.  Please ensure the patient is taking his Keppra. Ensure the patient has follow-up with neurology.  2.  Labs / imaging needed at  time of follow-up: None  3.  Pending labs/ test needing follow-up: Please follow up on the patient's CD4 count.  Follow-up Appointments: Follow-up Information    [facility] .   Why:  Resume HHST, add HHPT Contact information: [address] [place] [address] [phone]           Hospital Course by problem list:   1. Seizure The patient [facility] department on 07/08/2016 following a witnessed seizure at home. Per the patient's wife he was in his usual state of health without evidence of fever, vomiting, cough or other infectious prodrome. The patient recently had a left MCA stroke which is most likely serving as an epileptogenic focus. In the emergency department at time of presentation the patient was afebrile. BP 138/74, pulse 92, respiratory rate 15 setting 100% on room air. At the time of presentation the patient's UDS and alcohol level were negative. The patient had an MRI brain which did not demonstrate any acute intracranial abnormality. Following the MRI the patient had a second seizure which was witnessed by a provider in the emergency department. He was loaded with 1000 mg of Keppra and neurology was consulted. While in the inpatient setting the patient was evaluated by neurology and EEG was done. EEG did not show evidence of ongoing seizure or definite evidence of seizure predisposition. Neurology recommended starting the patient on Keppra  500 mg twice daily and follow-up in the [place] system with a neurologist. On the day of discharge the patient was afebrile and hemodynamicly stable. He was started on Keppra 500 mg twice daily per neurology recommendation.  2. Hypertension The patient has a history of hypertension. He was hypertensive during his hospital admission and his amlodipine was resumed. He will be discharged on his home dose of 10 mg amlodipine once daily.  3. Chronic medical conditions Patient has a history of HIV. He  will be discharged on his home regimen. He would benefit from follow-up with infectious diseases in the [place] system.  Discharge Vitals:   BP (!) 141/85 (BP Location: Left Arm)   Pulse 93   Temp 99.1 F (37.3 C) (Oral)   Resp 16   Ht 6' (1.829 m)   Wt 153 lb 14.1 oz (69.8 kg)   SpO2 92%   BMI 20.87 kg/m   Pertinent Labs, Studies, and Procedures:  1. MRI brain-no acute intracranial abnormality 2. EEG- no evidence of ongoing seizure or definite evidence of seizure predisposition  Discharge Instructions: Discharge Instructions    Diet - low sodium heart healthy    Complete by:  As directed    Discharge instructions    Complete by:  As directed    Please continue to take all of your medications as prescribed.  We have started a new medication called Keppra. Take this medicine once in the morning and once in the evening for a total of twice daily to prevent seizures.  Please schedule an appointment to see your primary care physician at the [facility] Additionally, please ensure your physician schedules an appointment for you to see a neurologist.   Increase activity slowly    Complete by:  As directed       Signed: Thomasene Lot, MD 07/10/2016, 2:26 PM   Pager: [phone]

## 2016-07-10 NOTE — Progress Notes (Signed)
   Subjective: No acute events overnight. Patient continues to improve. His mental status has also improved. He was complaining of mild chest pain this morning. His chest pain resolved with the administration of a GI cocktail. Speaking with him again later this afternoon he denied headache, shortness of breath, chest pain, nausea, vomiting or abdominal pain. He has no additional acute complaints or concerns this morning. He is appropriate for discharge.  Objective:  Vital signs in last 24 hours: Vitals:   07/10/16 0500 07/10/16 0600 07/10/16 0648 07/10/16 1132  BP: (!) 153/85 136/80 (!) 141/87 (!) 141/85  Pulse: 86 76 80 93  Resp: 20 18 19 16   Temp:   99.4 F (37.4 C) 99.1 F (37.3 C)  TempSrc:   Oral Oral  SpO2: 97% 94% 97% 92%  Weight:      Height:       Physical Exam  Constitutional: He appears well-developed and well-nourished.  HENT:  Head: Normocephalic and atraumatic.  Poor dentition, well healing anterior midline tongue laceration  Cardiovascular: Normal rate and regular rhythm.  Exam reveals no gallop and no friction rub.   No murmur heard. Respiratory: Effort normal and breath sounds normal. No respiratory distress. He has no wheezes.  GI: Soft. Bowel sounds are normal. He exhibits no distension. There is no tenderness.  Bowel sounds present, no abdominal or renal bruits auscultated on examination  Musculoskeletal: He exhibits no edema.  Neurological: He is alert.  Mental status is improved. He is returned to his baseline.     Assessment/Plan: [name] is a 60 year old African-American male with a past medical history of HIV, polysubstance abuse, hepatitis C and previous left MCA stroke who presents with a one-day history of seizures.  1. Seizures, stable Patient presented with aseizure at home and a single witnessed seizure in the emergency department. It is unclear if he has had seizures in the past. Currently, he has no evidence of infection. His urine drug  screen was negative. MRI of the brain demonstrates chronic left MCA infarct without acute intracranial abnormality. The patient's previous left MCA infarct may be acting as an epileptogenic focus and may be the etiology of the patient's seizures. -- Keppra 1000 mg 1 then 500 mg twice a day -- Neurology has signed off -- EEG-  no evidence of ongoing seizure or definite evidence of seizure predisposition   2. Polysubstance abuse, stable Patient with known polysubstance abuse including tobacco, cocaine and alcohol. UDS negative. -- CIWA protocol -- Folic acid, thiamine  3. Chest pain without shortness of breath, resolved The patient had mild chest pain this morning without associated nausea, vomiting or shortness of breath. An EKG was ordered which did not demonstrate any acute ST segment changes. He was given a GI cocktail with symptomatic relief. Upon reassessment the patient denied chest pain, shortness of breath, nausea, vomiting or abdominal pain.The etiology of this chest pain was most likely gastroesophageal reflux disease and resolved with a GI cocktail.  4. HIV Patient with a history of HIV infection. Most recent CD4 count on 12/2015 of 497. Patient currently nothing by mouth secondary to seizure activity. -- Restart HIV medications when tolerating PO -- Home Regimen (abacavir/dolutegravir/lamivudine -- HIV 1 RNA Quant 80  5. Hypertension -- Started amlodipine 10 mg once daily  6. DVT/PE prophylaxis -- Lovenox 40 mg subcutaneous injection once daily  Dispo: Anticipated discharge today.   Thomasene LotJames Wajiha Versteeg, MD 07/10/2016, 11:39 AM Pager: [phone]

## 2017-09-07 IMAGING — CT CT CERVICAL SPINE W/O CM
2 of 8 series · 6 of 33 positions shown, 7 images · non-contrast
Comparison: 04/23/2008

CLINICAL DATA: Seizure and fall. History of HIV. Incontinence and
time laceration. Altered mental status.

EXAM:
CT HEAD WITHOUT CONTRAST
CT CERVICAL SPINE WITHOUT CONTRAST
TECHNIQUE: Multidetector CT imaging of the head and cervical spine was
performed following the standard protocol without intravenous
contrast. Multiplanar CT image reconstructions of the cervical spine
were also generated.

[Series 304: orthos · axial · 0.39mm/px · z∈[-68,+36]mm · 3 of 111 slices shown, 4 images]
[im 28/111  soft-tissue]
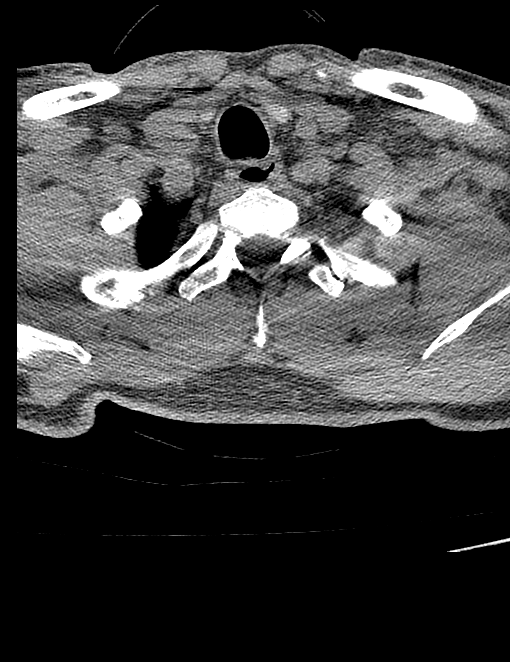
[im 28/111  bone]
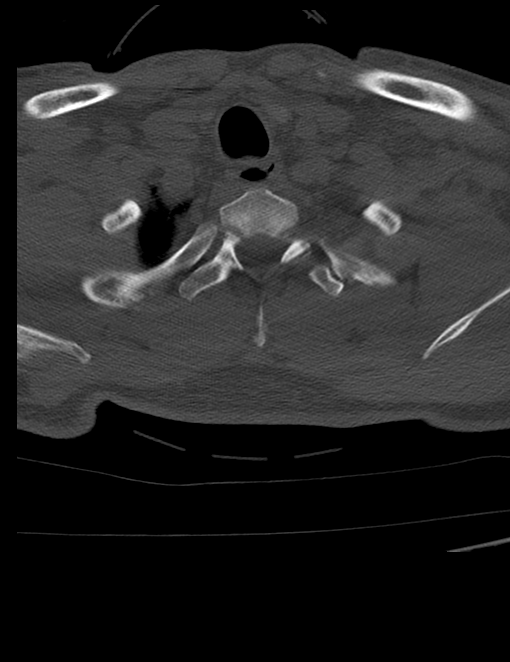
[im 56/111  bone]
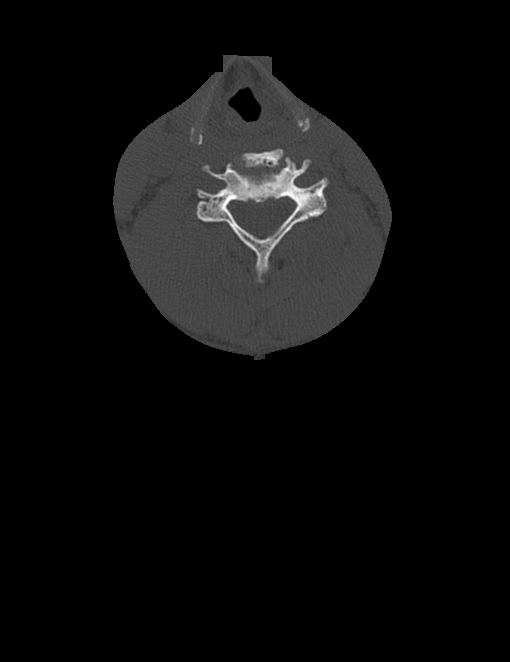
[im 83/111  bone]
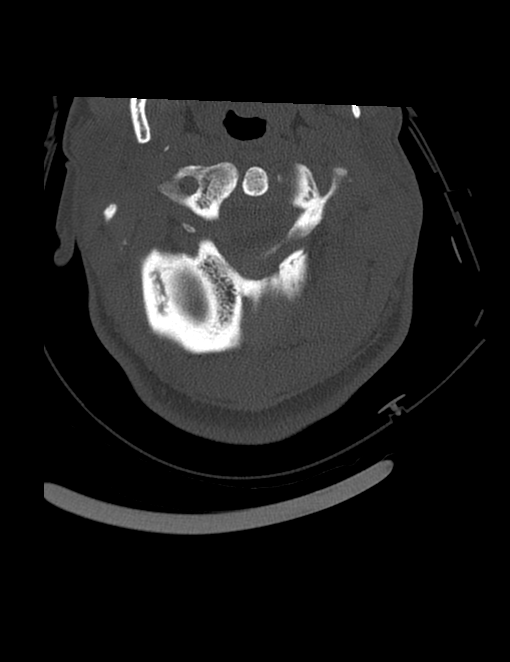

[Series 306: sag · sagittal · 0.39mm/px · 3 of 48 slices shown]
[im 12/48  bone]
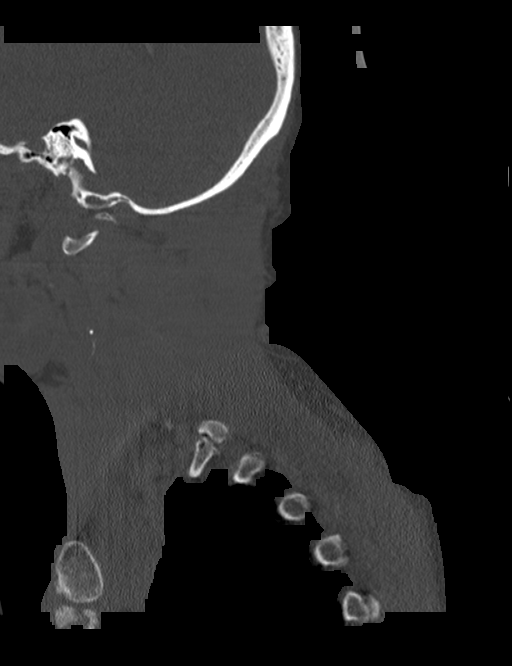
[im 24/48  bone]
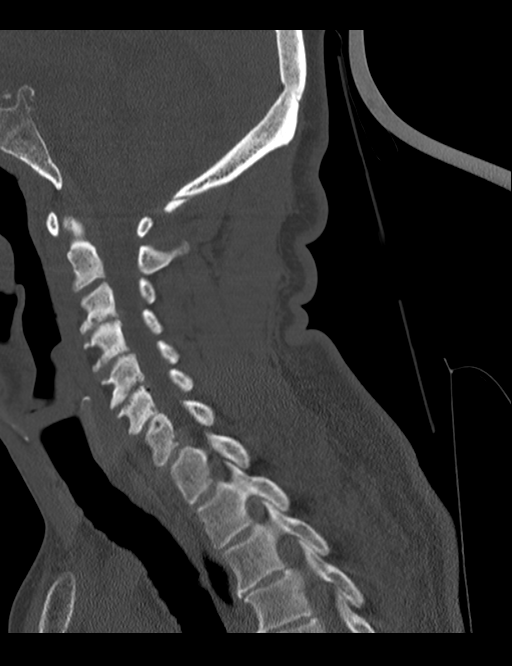
[im 36/48  bone]
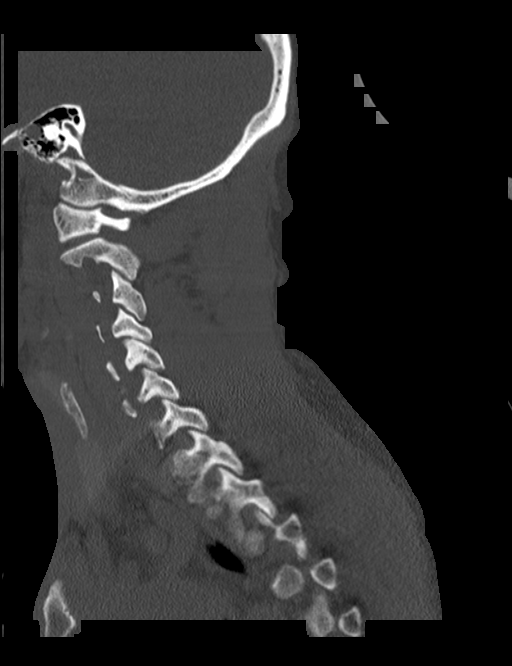

[6 of 33 positions shown; findings below may reference images not displayed]

FINDINGS: CT HEAD FINDINGS

Brain: Focal area of low attenuation demonstrated in the left
posterior frontal lobe and frontal white matter. This was not
present previously and could represent ischemia or mass lesion. MRI
suggested for further evaluation. There is no significant mass
effect or midline shift. Ventricles are not dilated. No abnormal
extra-axial fluid collections. Gray-white matter junctions are
distinct. Basal cisterns are not effaced. No acute intracranial
hemorrhage.

Vascular: No hyperdense vessel or unexpected calcification.

Skull: Normal. Negative for fracture or focal lesion.

Sinuses/Orbits: No acute finding.

Other: None.

CT CERVICAL SPINE FINDINGS

Alignment: Normal.

Skull base and vertebrae: No acute fracture. No primary bone lesion
or focal pathologic process.

Soft tissues and spinal canal: No prevertebral fluid or swelling. No
visible canal hematoma.

Disc levels: Degenerative changes with narrowed disc spaces and
endplate hypertrophic changes at C3-4, C4-5, and C5-6 levels.
Prominent posterior osteophytes or disc osteophyte complexes
demonstrated at C3-4 and C5-6 which cause some encroachment upon the
central canal.

Upper chest: Hazy opacities in the upper lungs could be due to
motion artifact or may indicate edema. Calcified granulomas in the
left upper lung. Aortic calcification.

Other: Vascular calcifications in the cervical carotid arteries.
IMPRESSION: Grade low-attenuation or encephalomalacia in the left posterior
frontal lobe and frontal white matter, new since prior study. This
could represent ischemia, infarct, or mass lesion. MRI suggested for
further evaluation. No significant mass effect or midline shift. No
acute intracranial hemorrhage.

Normal alignment of the cervical spine. Degenerative changes are
present. No acute displaced fractures identified.

## 2018-01-13 ENCOUNTER — Emergency Department (HOSPITAL_COMMUNITY)
Admission: EM | Admit: 2018-01-13 | Discharge: 2018-01-14 | Disposition: A | Discharge status: Home / Self Care | Payer: Non-veteran care | Attending: Emergency Medicine | Admitting: Emergency Medicine

## 2018-01-13 ENCOUNTER — Encounter (HOSPITAL_COMMUNITY): Payer: Self-pay | Admitting: Emergency Medicine

## 2018-01-13 DIAGNOSIS — R5383 Other fatigue: Secondary | ICD-10-CM | POA: Diagnosis not present

## 2018-01-13 NOTE — ED Triage Notes (Addendum)
Patient here via EMS from home with complaints of drowsiness after taking pain medication today. Oxycodone 1 tablet as prescribed on an empty stomach. Hx of stroke. Wife reports all deficits are normal.

## 2018-03-12 ENCOUNTER — Encounter: Payer: Self-pay | Admitting: Family Medicine

## 2021-10-18 ENCOUNTER — Other Ambulatory Visit (HOSPITAL_COMMUNITY): Payer: Self-pay | Admitting: Radiology

## 2021-10-18 ENCOUNTER — Inpatient Hospital Stay (HOSPITAL_COMMUNITY): Admission: RE | Admit: 2021-10-18 | Payer: No Typology Code available for payment source | Source: Ambulatory Visit

## 2021-10-18 DIAGNOSIS — Z436 Encounter for attention to other artificial openings of urinary tract: Secondary | ICD-10-CM

## 2022-11-05 ENCOUNTER — Emergency Department (HOSPITAL_COMMUNITY): Payer: No Typology Code available for payment source

## 2022-11-05 ENCOUNTER — Other Ambulatory Visit: Payer: Self-pay

## 2022-11-05 ENCOUNTER — Emergency Department (HOSPITAL_COMMUNITY)
Admission: EM | Admit: 2022-11-05 | Discharge: 2022-11-05 | Disposition: A | Discharge status: Home / Self Care | Payer: No Typology Code available for payment source | Attending: Emergency Medicine | Admitting: Emergency Medicine

## 2022-11-05 DIAGNOSIS — R079 Chest pain, unspecified: Secondary | ICD-10-CM | POA: Insufficient documentation

## 2022-11-05 DIAGNOSIS — I10 Essential (primary) hypertension: Secondary | ICD-10-CM | POA: Diagnosis not present

## 2022-11-05 DIAGNOSIS — Z8673 Personal history of transient ischemic attack (TIA), and cerebral infarction without residual deficits: Secondary | ICD-10-CM | POA: Insufficient documentation

## 2022-11-05 LAB — COMPREHENSIVE METABOLIC PANEL
ALT: 33 U/L (ref 0–44)
AST: 36 U/L (ref 15–41)
Albumin: 4 g/dL (ref 3.5–5.0)
Alkaline Phosphatase: 62 U/L (ref 38–126)
Anion gap: 8 (ref 5–15)
BUN: 11 mg/dL (ref 8–23)
CO2: 23 mmol/L (ref 22–32)
Calcium: 9.1 mg/dL (ref 8.9–10.3)
Chloride: 105 mmol/L (ref 98–111)
Creatinine, Ser: 1.55 mg/dL — ABNORMAL HIGH (ref 0.61–1.24)
GFR, Estimated: 48 mL/min — ABNORMAL LOW (ref >60)
Glucose, Bld: 129 mg/dL — ABNORMAL HIGH (ref 70–99)
Potassium: 3.6 mmol/L (ref 3.5–5.1)
Sodium: 136 mmol/L (ref 135–145)
Total Bilirubin: 0.6 mg/dL (ref 0.3–1.2)
Total Protein: 7.7 g/dL (ref 6.5–8.1)

## 2022-11-05 LAB — CBC WITH DIFFERENTIAL/PLATELET
Abs Immature Granulocytes: 0.03 10*3/uL (ref 0.00–0.07)
Basophils Absolute: 0 10*3/uL (ref 0.0–0.1)
Basophils Relative: 1 %
Eosinophils Absolute: 0.3 10*3/uL (ref 0.0–0.5)
Eosinophils Relative: 4 %
HCT: 43.3 % (ref 39.0–52.0)
Hemoglobin: 14.9 g/dL (ref 13.0–17.0)
Immature Granulocytes: 0 %
Lymphocytes Relative: 25 %
Lymphs Abs: 2 10*3/uL (ref 0.7–4.0)
MCH: 31.8 pg (ref 26.0–34.0)
MCHC: 34.4 g/dL (ref 30.0–36.0)
MCV: 92.3 fL (ref 80.0–100.0)
Monocytes Absolute: 0.7 10*3/uL (ref 0.1–1.0)
Monocytes Relative: 9 %
Neutro Abs: 4.9 10*3/uL (ref 1.7–7.7)
Neutrophils Relative %: 61 %
Platelets: 229 10*3/uL (ref 150–400)
RBC: 4.69 MIL/uL (ref 4.22–5.81)
RDW: 12.5 % (ref 11.5–15.5)
WBC: 8 10*3/uL (ref 4.0–10.5)
nRBC: 0 % (ref 0.0–0.2)

## 2022-11-05 LAB — TROPONIN I (HIGH SENSITIVITY)
Troponin I (High Sensitivity): 6 ng/L (ref <18)
Troponin I (High Sensitivity): 6 ng/L (ref <18)

## 2022-11-05 LAB — LIPASE, BLOOD: Lipase: 32 U/L (ref 11–51)

## 2022-11-05 MED ORDER — ALUM & MAG HYDROXIDE-SIMETH 200-200-20 MG/5ML PO SUSP
30.0000 mL | Freq: Once | ORAL | Status: AC
  Administered 2022-11-05: 30 mL via ORAL
  Filled 2022-11-05: qty 30

## 2022-11-05 MED ORDER — PANTOPRAZOLE SODIUM 20 MG PO TBEC: 20.0000 mg | DELAYED_RELEASE_TABLET | Freq: Every day | ORAL | 0 refills | Status: AC

## 2022-11-05 NOTE — ED Provider Notes (Signed)
Pt is a 67 yo male signed out by Dr. Tamera Punt.  Pt said the GI helped his sx and he feels well.  He had 2 negative troponins.  He is stable for d/c.  He knows to return if worse.    Isla Pence, MD 11/05/22 [phone]

## 2022-11-05 NOTE — Discharge Instructions (Addendum)
Make sure that you make an appointment to follow-up with your doctor this week.  Also your creatinine (kidney function) was elevated at 1.55.  I do not know what your baseline is because we do not have any old blood work in our system.  Make sure that you let your doctor know about this to make sure it has not changed.  Return to the emergency room if you have any worsening symptoms.

## 2022-11-05 NOTE — ED Provider Triage Note (Signed)
Emergency Medicine Provider Triage Evaluation Note  Lief Palmatier , a 67 y.o. male  was evaluated in triage.  Pt complains of sudden onset of left sided chest pain while at church today. History not completely clear as patient stutters frequently, but seems to endorse pain radiating to left arm and shortness of breath at time of pain onset. Mild RUQ abdominal discomfort, no nausea or vomiting..  Review of Systems  Positive: Chest pain, shortness of breath, abdominal pain Negative: Nausea, vomitng  Physical Exam  BP 123/84   Pulse 73   Temp (!) 97.5 F (36.4 C)   Resp 12   SpO2 98%  Gen:   Awake, no distress   Resp:  Normal effort. CTA bilaterally MSK:   Moves extremities without difficulty, no pedal edema Other:  Diffuse RUQ discomfort, abdomen soft  Medical Decision Making  Medically screening exam initiated at 1:09 PM.  Appropriate orders placed.  Jahzion Brogden was informed that the remainder of the evaluation will be completed by another provider, this initial triage assessment does not replace that evaluation, and the importance of remaining in the ED until their evaluation is complete.     Etta Quill, NP 11/05/22 1314

## 2022-11-05 NOTE — ED Provider Notes (Addendum)
[facility] Provider Note   CSN: [id number] Arrival date & time: 11/05/22  1241     History  Chief Complaint  Patient presents with   Chest Pain    Adam Richardson is a 67 y.o. male.  Patient is a 67 year old male who presents with chest pain.  He has a history of prior stroke in 2017, hypertension, hyperlipidemia.  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  He states that he was at church today sitting in the pew and developed some left-sided chest pain.  Radiating a little bit to his left arm.  He had little bit of shortness of breath.  He says his symptoms have eased off currently.  He also had a little bit of epigastric pain at the time and said he was concerned he may have taken his medications on an empty stomach.  No prior history of heart disease.  He is a non-smoker.  No history of diabetes.       Home Medications Prior to Admission medications   Not on File      Allergies    Patient has no known allergies.    Review of Systems   Review of Systems  Constitutional:  Negative for chills, diaphoresis, fatigue and fever.  HENT:  Negative for congestion, rhinorrhea and sneezing.   Eyes: Negative.   Respiratory:  Positive for chest tightness and shortness of breath. Negative for cough.   Cardiovascular:  Negative for chest pain and leg swelling.  Gastrointestinal:  Positive for abdominal pain (Mild epigastric discomfort). Negative for blood in stool, diarrhea, nausea and vomiting.  Genitourinary:  Negative for difficulty urinating, flank pain, frequency and hematuria.  Musculoskeletal:  Negative for arthralgias and back pain.  Skin:  Negative for rash.  Neurological:  Negative for dizziness, speech difficulty, weakness, numbness and headaches.    Physical Exam Updated Vital Signs BP 124/79   Pulse 60   Temp (!) 97.5 F (36.4 C)   Resp 12   SpO2 99%  Physical Exam Constitutional:      Appearance: He is  well-developed.  HENT:     Head: Normocephalic and atraumatic.  Eyes:     Pupils: Pupils are equal, round, and reactive to light.  Cardiovascular:     Rate and Rhythm: Normal rate and regular rhythm.     Heart sounds: Normal heart sounds.  Pulmonary:     Effort: Pulmonary effort is normal. No respiratory distress.     Breath sounds: Normal breath sounds. No wheezing or rales.  Chest:     Chest wall: No tenderness.  Abdominal:     General: Bowel sounds are normal.     Palpations: Abdomen is soft.     Tenderness: There is no abdominal tenderness. There is no guarding or rebound.  Musculoskeletal:        General: Normal range of motion.     Cervical back: Normal range of motion and neck supple.     Comments: No edema or calf tenderness  Lymphadenopathy:     Cervical: No cervical adenopathy.  Skin:    General: Skin is warm and dry.     Findings: No rash.  Neurological:     Mental Status: He is alert and oriented to person, place, and time.     ED Results / Procedures / Treatments   Labs (all labs ordered are listed, but only abnormal results are displayed) Labs Reviewed  COMPREHENSIVE METABOLIC PANEL - Abnormal; Notable  for the following components:      Result Value   Glucose, Bld 129 (*)    Creatinine, Ser 1.55 (*)    GFR, Estimated 48 (*)    All other components within normal limits  LIPASE, BLOOD  CBC WITH DIFFERENTIAL/PLATELET  TROPONIN I (HIGH SENSITIVITY)  TROPONIN I (HIGH SENSITIVITY)    EKG EKG Interpretation  Date/Time:  Sunday November 05 2022 12:33:08 EST Ventricular Rate:  71 PR Interval:  138 QRS Duration: 82 QT Interval:  390 QTC Calculation: 423 R Axis:   -4 Text Interpretation: Normal sinus rhythm Possible Left atrial enlargement Borderline ECG No previous ECGs available Confirmed by Malvin Johns [phone]) on 11/05/2022 1:26:35 PM  Radiology DG Chest 2 View  Result Date: 11/05/2022 CLINICAL DATA:  Left-sided chest pain. EXAM: CHEST - 2 VIEW  COMPARISON:  None Available. FINDINGS: The heart size and mediastinal contours are within normal limits. Both lungs are clear. The visualized skeletal structures are unremarkable. IMPRESSION: No active cardiopulmonary disease. Electronically Signed   By: Lovey Newcomer M.D.   On: 11/05/2022 13:50    Procedures Procedures    Medications Ordered in ED Medications  alum & mag hydroxide-simeth (MAALOX/MYLANTA) 200-200-20 MG/5ML suspension 30 mL (30 mLs Oral Given 11/05/22 1446)    ED Course/ Medical Decision Making/ A&P                             Medical Decision Making Amount and/or Complexity of Data Reviewed Radiology: ordered.  Risk OTC drugs.   Pt presents with CP.  Mild associated SOB.  No exertional symptoms.  His EKG does not show any ischemic changes.  His first troponin is negative.  His other labs are nonconcerning other than his creatinine is slightly elevated at 1.55.  We do not have any old labs in our system to compare to.  Chest x-ray does not show any evidence of pneumonia or pneumothorax.  This was interpreted by me and confirmed by the radiologist.  He does not have other symptoms that would be more concerning for PE or thoracic dissection.  He was given a GI cocktail and he is currently pain-free.  If his second troponin is negative, he can likely be discharged to follow-up with his doctor at the [facility].  I did talk to the patient and his wife about this and they are comfortable with this plan.  Dr. Gilford Raid to check results of 2nd troponin.  Final Clinical Impression(s) / ED Diagnoses Final diagnoses:  Nonspecific chest pain    Rx / DC Orders ED Discharge Orders     None         Malvin Johns, MD 11/05/22 1547    Malvin Johns, MD 11/05/22 [phone]

## 2022-11-05 NOTE — ED Triage Notes (Signed)
Pt here with L sided chest pain onset today while at church. Denies any radiation of pain, no shob.
